# Patient Record
Sex: Female | Born: 1955
Health system: Southern US, Community
[De-identification: ages and names within clinical notes are randomized; demographics above are authoritative.]

---

## 2006-03-22 ENCOUNTER — Ambulatory Visit: Payer: Self-pay | Admitting: Unknown Physician Specialty

## 2006-03-24 ENCOUNTER — Ambulatory Visit: Payer: Self-pay | Admitting: Unknown Physician Specialty

## 2007-03-22 IMAGING — US ULTRASOUND LEFT BREAST
1 series · 11 of 11 positions shown · non-contrast
Comparison: none

REASON FOR EXAM: Nodular density
                       U/S if needed
COMMENTS:

[Series 1: ultrasound left breast · 11 of 11 slices shown]
[im 1/11]
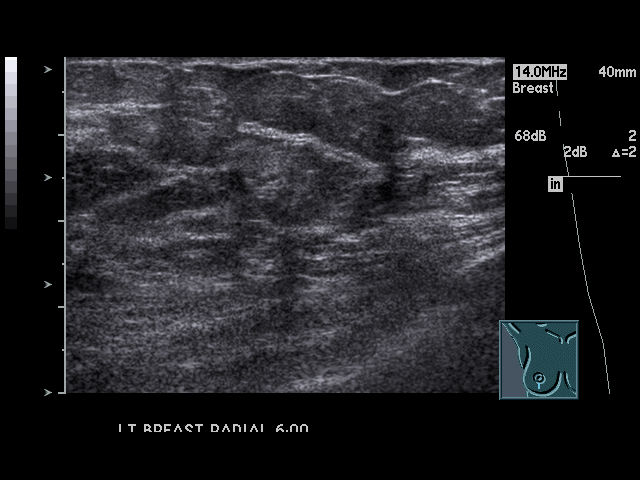
[im 2/11]
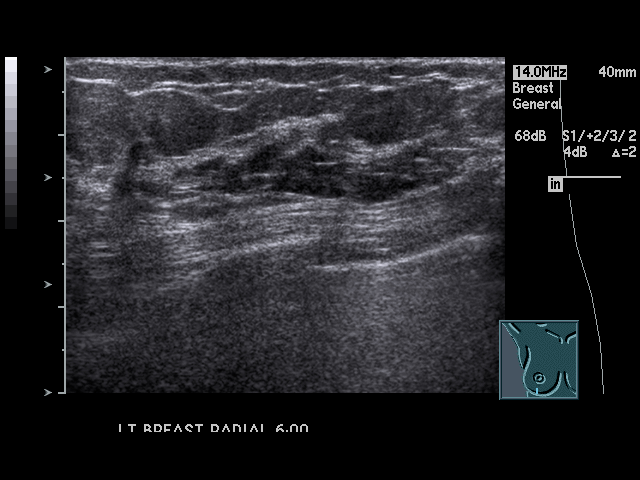
[im 3/11]
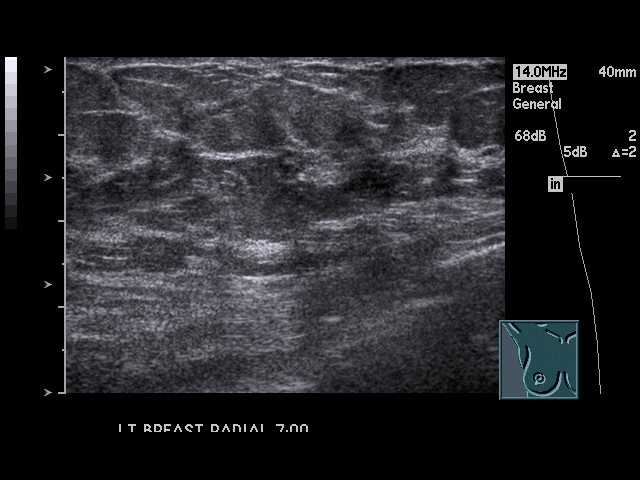
[im 4/11]
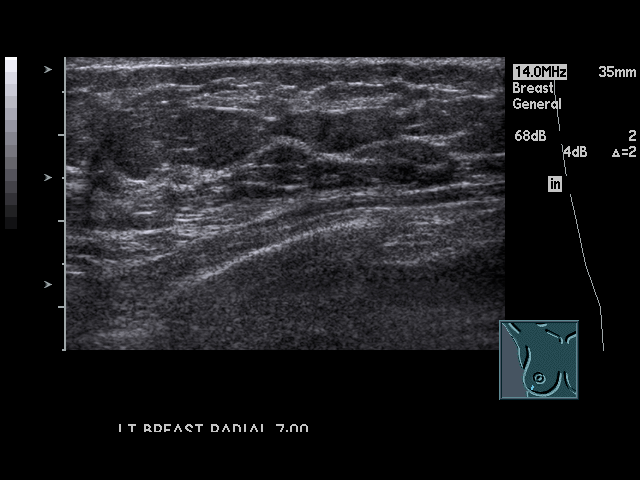
[im 5/11]
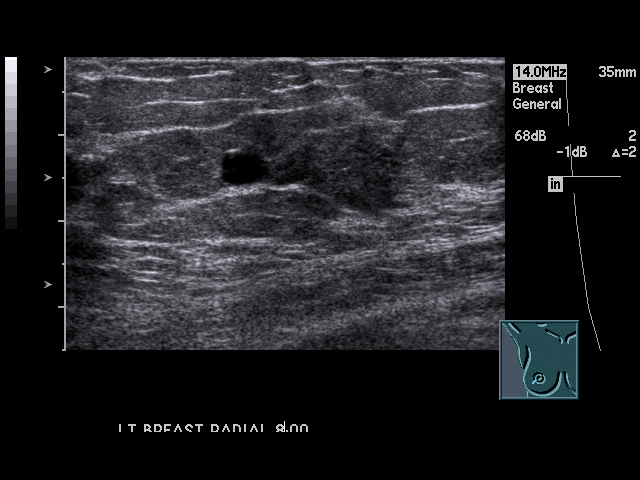
[im 6/11]
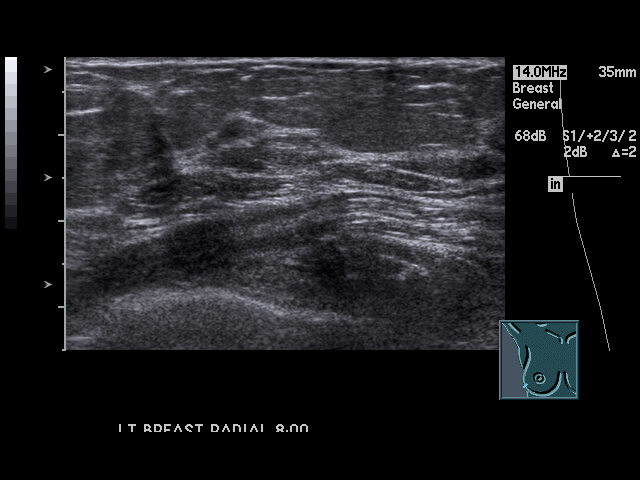
[im 7/11]
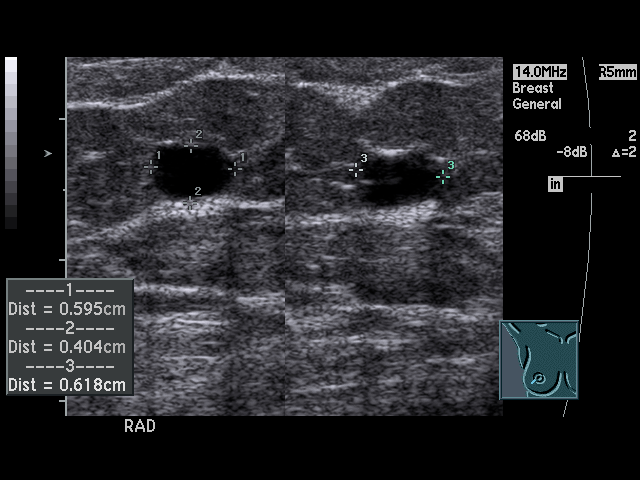
[im 8/11]
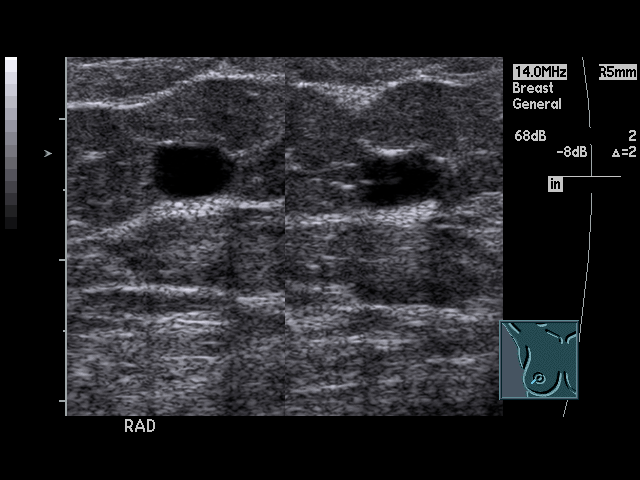
[im 9/11]
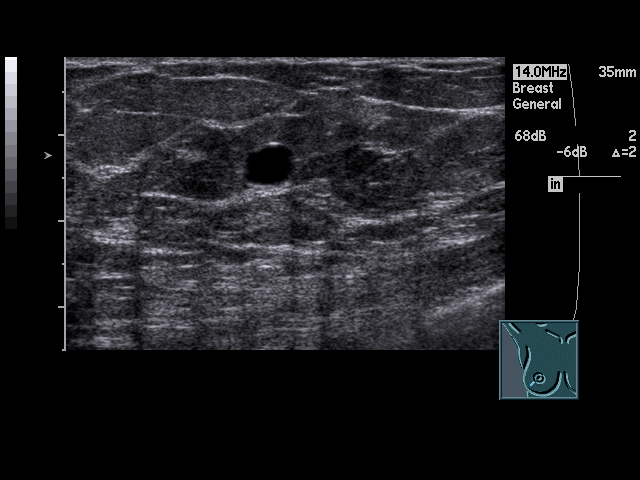
[im 10/11]
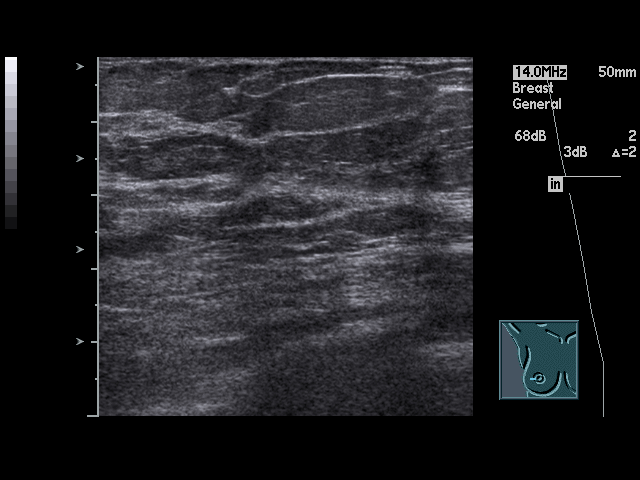
[im 11/11]
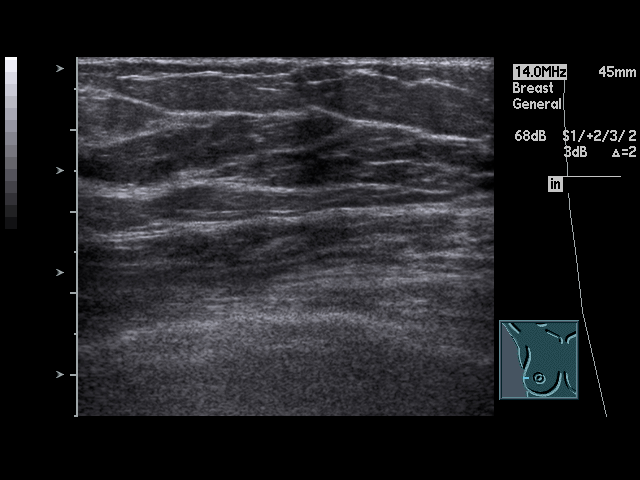

[11 of 11 positions shown; findings below may reference images not displayed]

PROCEDURE:     US  - US BREAST LEFT  - March 24, 2006  [DATE]

RESULT:     An approximately 6.0 mm cyst is present in the inferior medial
portion of the LEFT breast in the region of mammographic abnormality. No
further evaluation is necessary at this time. Yearly follow-up mammograms
can be obtained.
IMPRESSION: Simple cyst, LEFT breast.

## 2007-04-27 ENCOUNTER — Ambulatory Visit: Payer: Self-pay | Admitting: Unknown Physician Specialty

## 2007-05-05 ENCOUNTER — Ambulatory Visit: Payer: Self-pay | Admitting: Unknown Physician Specialty

## 2009-10-15 ENCOUNTER — Ambulatory Visit: Payer: Self-pay | Admitting: Unknown Physician Specialty

## 2011-06-01 ENCOUNTER — Ambulatory Visit: Payer: Self-pay | Admitting: Unknown Physician Specialty

## 2011-06-02 ENCOUNTER — Ambulatory Visit: Payer: Self-pay | Admitting: Unknown Physician Specialty

## 2011-06-14 ENCOUNTER — Ambulatory Visit: Payer: Self-pay | Admitting: Unknown Physician Specialty

## 2018-01-18 DIAGNOSIS — Z9181 History of falling: Secondary | ICD-10-CM | POA: Diagnosis not present

## 2018-01-18 DIAGNOSIS — R269 Unspecified abnormalities of gait and mobility: Secondary | ICD-10-CM | POA: Diagnosis not present

## 2018-02-13 DIAGNOSIS — R269 Unspecified abnormalities of gait and mobility: Secondary | ICD-10-CM | POA: Diagnosis not present

## 2018-02-13 DIAGNOSIS — Z9181 History of falling: Secondary | ICD-10-CM | POA: Diagnosis not present

## 2018-04-05 DIAGNOSIS — Z0000 Encounter for general adult medical examination without abnormal findings: Secondary | ICD-10-CM | POA: Diagnosis not present

## 2018-04-05 DIAGNOSIS — G4733 Obstructive sleep apnea (adult) (pediatric): Secondary | ICD-10-CM | POA: Diagnosis not present

## 2018-04-05 DIAGNOSIS — Z9889 Other specified postprocedural states: Secondary | ICD-10-CM | POA: Diagnosis not present

## 2018-04-05 DIAGNOSIS — D329 Benign neoplasm of meninges, unspecified: Secondary | ICD-10-CM | POA: Diagnosis not present

## 2018-04-05 DIAGNOSIS — F0280 Dementia in other diseases classified elsewhere without behavioral disturbance: Secondary | ICD-10-CM | POA: Diagnosis not present

## 2018-04-05 DIAGNOSIS — G309 Alzheimer's disease, unspecified: Secondary | ICD-10-CM | POA: Diagnosis not present

## 2018-04-05 DIAGNOSIS — I10 Essential (primary) hypertension: Secondary | ICD-10-CM | POA: Diagnosis not present

## 2018-04-05 DIAGNOSIS — E7889 Other lipoprotein metabolism disorders: Secondary | ICD-10-CM | POA: Diagnosis not present

## 2018-04-05 DIAGNOSIS — G300 Alzheimer's disease with early onset: Secondary | ICD-10-CM | POA: Diagnosis not present

## 2018-04-05 DIAGNOSIS — E039 Hypothyroidism, unspecified: Secondary | ICD-10-CM | POA: Diagnosis not present

## 2018-05-08 DIAGNOSIS — E039 Hypothyroidism, unspecified: Secondary | ICD-10-CM | POA: Diagnosis not present

## 2018-05-08 DIAGNOSIS — R29898 Other symptoms and signs involving the musculoskeletal system: Secondary | ICD-10-CM | POA: Diagnosis not present

## 2018-05-08 DIAGNOSIS — I10 Essential (primary) hypertension: Secondary | ICD-10-CM | POA: Diagnosis not present

## 2018-05-08 DIAGNOSIS — R413 Other amnesia: Secondary | ICD-10-CM | POA: Diagnosis not present

## 2018-05-09 ENCOUNTER — Other Ambulatory Visit: Payer: Self-pay | Admitting: *Deleted

## 2018-05-09 NOTE — Patient Outreach (Signed)
[place] [facility]) Care Management  05/09/2018  Deborah Davies [birth year] [id number]   Telephone Screen  Referral Date: 05/08/18  Referral Source: Nurse call center Referral Reason: Has Alzheimer's, fell with walking, wobbly, did not loose consciousness, lethargic on 05/07/18  Insurance: HTA  Outreach attempt # 1 Outreach attempt to patient. No answer and unable to leave voicemail message at number left for nurse center as [phone] Number [phone] is not available No answer, mail box full for [phone] found for spouse in care everywhere   Plan: [facility] RN CM sent an unsuccessful outreach letter and scheduled this patient for another call attempt within 4 business days   Kimberly [name], RN, BSN, CCM [facility] Telephonic Care Management Care Coordinator Direct number [phone]  Main [facility] number [phone] Fax number [phone]

## 2018-05-10 ENCOUNTER — Other Ambulatory Visit: Payer: Self-pay | Admitting: *Deleted

## 2018-05-10 DIAGNOSIS — G300 Alzheimer's disease with early onset: Secondary | ICD-10-CM | POA: Diagnosis not present

## 2018-05-10 DIAGNOSIS — F0280 Dementia in other diseases classified elsewhere without behavioral disturbance: Secondary | ICD-10-CM | POA: Diagnosis not present

## 2018-05-10 NOTE — Patient Outreach (Signed)
[place] [facility]) Care Management  05/10/2018  NGUYEN BUTLER [birth year] [id number]   Telephone Screen  Referral Date: 05/08/18  Referral Source: Nurse call center Referral Reason: Has Alzheimer's, fell with walking, wobbly, did not loose consciousness, lethargic on 05/07/18  Insurance: HTA  Outreach attempt # 2 Outreach attempt to patient. No answer and unable to leave voicemail message at number left for nurse center as [phone] Number [phone] is not available a message directs you to [phone], spouse's mobile number  No answer, mail box full for [phone] found for spouse's mobile number also found in care everywhere Another attempt made to reach Mr Calmes at his mobile number and a message was left   Plan: [facility] RN CM will attempt the third call attempt within in 1-2 business days  CM sent an unsuccessful outreach letter on 05/09/18  Joelene Millin [name], RN, BSN, CCM [facility] Telephonic Care Management Care Coordinator Direct number [phone]  Main [facility] number [phone] Fax number [phone]

## 2018-05-10 NOTE — Patient Outreach (Signed)
[name]) Care Management  05/10/2018  Deborah Davies 07-10-1956 [id number]   Telephone Screen  Referral Date: 05/08/18  Referral Source: Nurse call center Referral Reason: Has Alzheimer's, fell with walking, wobbly, did not loose consciousness, lethargic on 05/07/18  Insurance: HTA  Mr Dial returned a call to [facility] RN CM  He confirmed [name] had an appointment on Monday May 08 2018, labs complete and was informed the MD may reduce some medicines The MD indicated that [name] may have just been exhausted from walking bare foot in the sand an extended distance. [name] confirms [name] is in a research study at this time and they are on their way today to this research study connected with [facility] At this time [name] still is able to complete ADLs with some monitoring but needs assist with iADLs from [name] and family.   [name] denies concerns with taking medications as prescribed, affording medications, side effects of medications and questions about medications He denies need of services from [facility] Community/Telephonic RN CM, pharmacy or SW at this time He states he will take in considerations the services available and contact CM if services may be needed in the future He voiced appreciation of the contact made by [place] RN CM will close case at this time as patient has been assessed and no needs identified.   [name] [name], RN, BSN, CCM [name] Telephonic Care Management Care Coordinator Direct number [phone]  Main [facility] number [phone] Fax number [phone]

## 2018-05-11 ENCOUNTER — Ambulatory Visit: Payer: Self-pay | Admitting: *Deleted

## 2018-05-12 DIAGNOSIS — G4733 Obstructive sleep apnea (adult) (pediatric): Secondary | ICD-10-CM | POA: Diagnosis not present

## 2018-05-12 DIAGNOSIS — Z9989 Dependence on other enabling machines and devices: Secondary | ICD-10-CM | POA: Diagnosis not present

## 2018-05-12 DIAGNOSIS — F0280 Dementia in other diseases classified elsewhere without behavioral disturbance: Secondary | ICD-10-CM | POA: Diagnosis not present

## 2018-05-12 DIAGNOSIS — I10 Essential (primary) hypertension: Secondary | ICD-10-CM | POA: Diagnosis not present

## 2018-05-12 DIAGNOSIS — G300 Alzheimer's disease with early onset: Secondary | ICD-10-CM | POA: Diagnosis not present

## 2018-08-17 DIAGNOSIS — H52203 Unspecified astigmatism, bilateral: Secondary | ICD-10-CM | POA: Diagnosis not present

## 2018-08-17 DIAGNOSIS — Z9889 Other specified postprocedural states: Secondary | ICD-10-CM | POA: Diagnosis not present

## 2018-08-17 DIAGNOSIS — H5213 Myopia, bilateral: Secondary | ICD-10-CM | POA: Diagnosis not present

## 2018-08-17 DIAGNOSIS — H524 Presbyopia: Secondary | ICD-10-CM | POA: Diagnosis not present

## 2018-08-17 DIAGNOSIS — H519 Unspecified disorder of binocular movement: Secondary | ICD-10-CM | POA: Diagnosis not present

## 2018-08-25 DIAGNOSIS — J310 Chronic rhinitis: Secondary | ICD-10-CM | POA: Diagnosis not present

## 2018-08-25 DIAGNOSIS — F0280 Dementia in other diseases classified elsewhere without behavioral disturbance: Secondary | ICD-10-CM | POA: Diagnosis not present

## 2018-08-25 DIAGNOSIS — G4733 Obstructive sleep apnea (adult) (pediatric): Secondary | ICD-10-CM | POA: Diagnosis not present

## 2018-08-25 DIAGNOSIS — Z9989 Dependence on other enabling machines and devices: Secondary | ICD-10-CM | POA: Diagnosis not present

## 2018-08-25 DIAGNOSIS — G309 Alzheimer's disease, unspecified: Secondary | ICD-10-CM | POA: Diagnosis not present

## 2018-08-25 DIAGNOSIS — I10 Essential (primary) hypertension: Secondary | ICD-10-CM | POA: Diagnosis not present

## 2018-08-25 DIAGNOSIS — G300 Alzheimer's disease with early onset: Secondary | ICD-10-CM | POA: Diagnosis not present

## 2018-08-25 DIAGNOSIS — R400 Somnolence: Secondary | ICD-10-CM | POA: Diagnosis not present

## 2018-08-25 DIAGNOSIS — J309 Allergic rhinitis, unspecified: Secondary | ICD-10-CM | POA: Diagnosis not present

## 2018-09-08 DIAGNOSIS — G4733 Obstructive sleep apnea (adult) (pediatric): Secondary | ICD-10-CM | POA: Diagnosis not present

## 2018-10-06 DIAGNOSIS — Z1211 Encounter for screening for malignant neoplasm of colon: Secondary | ICD-10-CM | POA: Diagnosis not present

## 2018-10-06 DIAGNOSIS — Z23 Encounter for immunization: Secondary | ICD-10-CM | POA: Diagnosis not present

## 2018-10-06 DIAGNOSIS — R635 Abnormal weight gain: Secondary | ICD-10-CM | POA: Diagnosis not present

## 2018-10-06 DIAGNOSIS — E039 Hypothyroidism, unspecified: Secondary | ICD-10-CM | POA: Diagnosis not present

## 2018-11-19 DIAGNOSIS — R4182 Altered mental status, unspecified: Secondary | ICD-10-CM | POA: Diagnosis not present

## 2018-11-19 DIAGNOSIS — R569 Unspecified convulsions: Secondary | ICD-10-CM | POA: Diagnosis not present

## 2018-11-19 DIAGNOSIS — R404 Transient alteration of awareness: Secondary | ICD-10-CM | POA: Diagnosis not present

## 2018-11-19 DIAGNOSIS — F0280 Dementia in other diseases classified elsewhere without behavioral disturbance: Secondary | ICD-10-CM | POA: Diagnosis not present

## 2018-11-19 DIAGNOSIS — G4089 Other seizures: Secondary | ICD-10-CM | POA: Diagnosis not present

## 2018-11-19 DIAGNOSIS — R4020 Unspecified coma: Secondary | ICD-10-CM | POA: Diagnosis not present

## 2018-11-19 DIAGNOSIS — G309 Alzheimer's disease, unspecified: Secondary | ICD-10-CM | POA: Diagnosis not present

## 2018-11-19 DIAGNOSIS — J9690 Respiratory failure, unspecified, unspecified whether with hypoxia or hypercapnia: Secondary | ICD-10-CM | POA: Diagnosis not present

## 2018-11-20 DIAGNOSIS — R569 Unspecified convulsions: Secondary | ICD-10-CM | POA: Diagnosis not present

## 2018-11-20 DIAGNOSIS — R531 Weakness: Secondary | ICD-10-CM | POA: Diagnosis not present

## 2018-11-20 DIAGNOSIS — R410 Disorientation, unspecified: Secondary | ICD-10-CM | POA: Diagnosis not present

## 2018-11-22 DIAGNOSIS — G4733 Obstructive sleep apnea (adult) (pediatric): Secondary | ICD-10-CM | POA: Diagnosis not present

## 2018-11-23 DIAGNOSIS — R569 Unspecified convulsions: Secondary | ICD-10-CM | POA: Diagnosis not present

## 2018-11-24 DIAGNOSIS — R569 Unspecified convulsions: Secondary | ICD-10-CM | POA: Diagnosis not present

## 2018-11-27 DIAGNOSIS — F0390 Unspecified dementia without behavioral disturbance: Secondary | ICD-10-CM | POA: Diagnosis not present

## 2018-11-27 DIAGNOSIS — E039 Hypothyroidism, unspecified: Secondary | ICD-10-CM | POA: Diagnosis not present

## 2018-11-27 DIAGNOSIS — G4730 Sleep apnea, unspecified: Secondary | ICD-10-CM | POA: Diagnosis not present

## 2018-11-27 DIAGNOSIS — Z9989 Dependence on other enabling machines and devices: Secondary | ICD-10-CM | POA: Diagnosis not present

## 2018-11-27 DIAGNOSIS — G4733 Obstructive sleep apnea (adult) (pediatric): Secondary | ICD-10-CM | POA: Diagnosis not present

## 2018-11-27 DIAGNOSIS — G40909 Epilepsy, unspecified, not intractable, without status epilepticus: Secondary | ICD-10-CM | POA: Diagnosis not present

## 2018-11-27 DIAGNOSIS — E785 Hyperlipidemia, unspecified: Secondary | ICD-10-CM | POA: Diagnosis not present

## 2018-11-27 DIAGNOSIS — D329 Benign neoplasm of meninges, unspecified: Secondary | ICD-10-CM | POA: Diagnosis not present

## 2018-11-27 DIAGNOSIS — I10 Essential (primary) hypertension: Secondary | ICD-10-CM | POA: Diagnosis not present

## 2018-11-28 DIAGNOSIS — R0602 Shortness of breath: Secondary | ICD-10-CM | POA: Diagnosis not present

## 2018-11-28 DIAGNOSIS — Z9889 Other specified postprocedural states: Secondary | ICD-10-CM | POA: Diagnosis not present

## 2018-11-28 DIAGNOSIS — F329 Major depressive disorder, single episode, unspecified: Secondary | ICD-10-CM | POA: Diagnosis not present

## 2018-11-28 DIAGNOSIS — Z9989 Dependence on other enabling machines and devices: Secondary | ICD-10-CM | POA: Diagnosis not present

## 2018-11-28 DIAGNOSIS — R569 Unspecified convulsions: Secondary | ICD-10-CM | POA: Diagnosis not present

## 2018-11-28 DIAGNOSIS — E872 Acidosis: Secondary | ICD-10-CM | POA: Diagnosis not present

## 2018-11-28 DIAGNOSIS — G9389 Other specified disorders of brain: Secondary | ICD-10-CM | POA: Diagnosis not present

## 2018-11-28 DIAGNOSIS — G300 Alzheimer's disease with early onset: Secondary | ICD-10-CM | POA: Diagnosis not present

## 2018-11-28 DIAGNOSIS — R413 Other amnesia: Secondary | ICD-10-CM | POA: Diagnosis not present

## 2018-11-28 DIAGNOSIS — F0280 Dementia in other diseases classified elsewhere without behavioral disturbance: Secondary | ICD-10-CM | POA: Diagnosis not present

## 2018-11-28 DIAGNOSIS — Z9119 Patient's noncompliance with other medical treatment and regimen: Secondary | ICD-10-CM | POA: Diagnosis not present

## 2018-11-28 DIAGNOSIS — Z66 Do not resuscitate: Secondary | ICD-10-CM | POA: Diagnosis not present

## 2018-11-28 DIAGNOSIS — J9601 Acute respiratory failure with hypoxia: Secondary | ICD-10-CM | POA: Diagnosis not present

## 2018-11-28 DIAGNOSIS — I10 Essential (primary) hypertension: Secondary | ICD-10-CM | POA: Diagnosis not present

## 2018-11-28 DIAGNOSIS — R2689 Other abnormalities of gait and mobility: Secondary | ICD-10-CM | POA: Diagnosis not present

## 2018-11-28 DIAGNOSIS — R05 Cough: Secondary | ICD-10-CM | POA: Diagnosis not present

## 2018-11-28 DIAGNOSIS — G4733 Obstructive sleep apnea (adult) (pediatric): Secondary | ICD-10-CM | POA: Diagnosis not present

## 2018-11-28 DIAGNOSIS — G40909 Epilepsy, unspecified, not intractable, without status epilepticus: Secondary | ICD-10-CM | POA: Diagnosis not present

## 2018-11-28 DIAGNOSIS — D329 Benign neoplasm of meninges, unspecified: Secondary | ICD-10-CM | POA: Diagnosis not present

## 2018-11-28 DIAGNOSIS — Z79899 Other long term (current) drug therapy: Secondary | ICD-10-CM | POA: Diagnosis not present

## 2018-11-29 DIAGNOSIS — F0280 Dementia in other diseases classified elsewhere without behavioral disturbance: Secondary | ICD-10-CM | POA: Diagnosis not present

## 2018-11-29 DIAGNOSIS — G4733 Obstructive sleep apnea (adult) (pediatric): Secondary | ICD-10-CM | POA: Diagnosis not present

## 2018-11-29 DIAGNOSIS — D329 Benign neoplasm of meninges, unspecified: Secondary | ICD-10-CM | POA: Diagnosis not present

## 2018-11-29 DIAGNOSIS — G300 Alzheimer's disease with early onset: Secondary | ICD-10-CM | POA: Diagnosis not present

## 2018-11-29 DIAGNOSIS — R569 Unspecified convulsions: Secondary | ICD-10-CM | POA: Diagnosis not present

## 2018-11-29 DIAGNOSIS — I10 Essential (primary) hypertension: Secondary | ICD-10-CM | POA: Diagnosis not present

## 2018-11-29 DIAGNOSIS — F329 Major depressive disorder, single episode, unspecified: Secondary | ICD-10-CM | POA: Diagnosis not present

## 2018-11-30 DIAGNOSIS — G4733 Obstructive sleep apnea (adult) (pediatric): Secondary | ICD-10-CM | POA: Diagnosis not present

## 2018-11-30 DIAGNOSIS — F0280 Dementia in other diseases classified elsewhere without behavioral disturbance: Secondary | ICD-10-CM | POA: Diagnosis not present

## 2018-11-30 DIAGNOSIS — G300 Alzheimer's disease with early onset: Secondary | ICD-10-CM | POA: Diagnosis not present

## 2018-11-30 DIAGNOSIS — F329 Major depressive disorder, single episode, unspecified: Secondary | ICD-10-CM | POA: Diagnosis not present

## 2018-11-30 DIAGNOSIS — I10 Essential (primary) hypertension: Secondary | ICD-10-CM | POA: Diagnosis not present

## 2018-11-30 DIAGNOSIS — D329 Benign neoplasm of meninges, unspecified: Secondary | ICD-10-CM | POA: Diagnosis not present

## 2018-11-30 DIAGNOSIS — R569 Unspecified convulsions: Secondary | ICD-10-CM | POA: Diagnosis not present

## 2018-12-01 DIAGNOSIS — G300 Alzheimer's disease with early onset: Secondary | ICD-10-CM | POA: Diagnosis not present

## 2018-12-01 DIAGNOSIS — D329 Benign neoplasm of meninges, unspecified: Secondary | ICD-10-CM | POA: Diagnosis not present

## 2018-12-01 DIAGNOSIS — F0280 Dementia in other diseases classified elsewhere without behavioral disturbance: Secondary | ICD-10-CM | POA: Diagnosis not present

## 2018-12-01 DIAGNOSIS — Z9119 Patient's noncompliance with other medical treatment and regimen: Secondary | ICD-10-CM | POA: Diagnosis not present

## 2018-12-01 DIAGNOSIS — G4733 Obstructive sleep apnea (adult) (pediatric): Secondary | ICD-10-CM | POA: Diagnosis not present

## 2018-12-02 DIAGNOSIS — F0280 Dementia in other diseases classified elsewhere without behavioral disturbance: Secondary | ICD-10-CM | POA: Diagnosis not present

## 2018-12-02 DIAGNOSIS — I10 Essential (primary) hypertension: Secondary | ICD-10-CM | POA: Diagnosis not present

## 2018-12-02 DIAGNOSIS — G300 Alzheimer's disease with early onset: Secondary | ICD-10-CM | POA: Diagnosis not present

## 2018-12-02 DIAGNOSIS — D329 Benign neoplasm of meninges, unspecified: Secondary | ICD-10-CM | POA: Diagnosis not present

## 2018-12-02 DIAGNOSIS — G4733 Obstructive sleep apnea (adult) (pediatric): Secondary | ICD-10-CM | POA: Diagnosis not present

## 2018-12-02 DIAGNOSIS — R569 Unspecified convulsions: Secondary | ICD-10-CM | POA: Diagnosis not present

## 2018-12-03 DIAGNOSIS — I10 Essential (primary) hypertension: Secondary | ICD-10-CM | POA: Diagnosis not present

## 2018-12-03 DIAGNOSIS — F0280 Dementia in other diseases classified elsewhere without behavioral disturbance: Secondary | ICD-10-CM | POA: Diagnosis not present

## 2018-12-03 DIAGNOSIS — G4733 Obstructive sleep apnea (adult) (pediatric): Secondary | ICD-10-CM | POA: Diagnosis not present

## 2018-12-03 DIAGNOSIS — R569 Unspecified convulsions: Secondary | ICD-10-CM | POA: Diagnosis not present

## 2018-12-03 DIAGNOSIS — G300 Alzheimer's disease with early onset: Secondary | ICD-10-CM | POA: Diagnosis not present

## 2018-12-03 DIAGNOSIS — D329 Benign neoplasm of meninges, unspecified: Secondary | ICD-10-CM | POA: Diagnosis not present

## 2018-12-04 DIAGNOSIS — F329 Major depressive disorder, single episode, unspecified: Secondary | ICD-10-CM | POA: Diagnosis not present

## 2018-12-04 DIAGNOSIS — D329 Benign neoplasm of meninges, unspecified: Secondary | ICD-10-CM | POA: Diagnosis not present

## 2018-12-04 DIAGNOSIS — F0280 Dementia in other diseases classified elsewhere without behavioral disturbance: Secondary | ICD-10-CM | POA: Diagnosis not present

## 2018-12-04 DIAGNOSIS — I10 Essential (primary) hypertension: Secondary | ICD-10-CM | POA: Diagnosis not present

## 2018-12-04 DIAGNOSIS — G4733 Obstructive sleep apnea (adult) (pediatric): Secondary | ICD-10-CM | POA: Diagnosis not present

## 2018-12-04 DIAGNOSIS — R569 Unspecified convulsions: Secondary | ICD-10-CM | POA: Diagnosis not present

## 2018-12-04 DIAGNOSIS — G300 Alzheimer's disease with early onset: Secondary | ICD-10-CM | POA: Diagnosis not present

## 2018-12-05 DIAGNOSIS — R569 Unspecified convulsions: Secondary | ICD-10-CM | POA: Diagnosis not present

## 2018-12-05 DIAGNOSIS — F0280 Dementia in other diseases classified elsewhere without behavioral disturbance: Secondary | ICD-10-CM | POA: Diagnosis not present

## 2018-12-05 DIAGNOSIS — G4733 Obstructive sleep apnea (adult) (pediatric): Secondary | ICD-10-CM | POA: Diagnosis not present

## 2018-12-05 DIAGNOSIS — I10 Essential (primary) hypertension: Secondary | ICD-10-CM | POA: Diagnosis not present

## 2018-12-05 DIAGNOSIS — G300 Alzheimer's disease with early onset: Secondary | ICD-10-CM | POA: Diagnosis not present

## 2018-12-05 DIAGNOSIS — D329 Benign neoplasm of meninges, unspecified: Secondary | ICD-10-CM | POA: Diagnosis not present

## 2018-12-05 DIAGNOSIS — R05 Cough: Secondary | ICD-10-CM | POA: Diagnosis not present

## 2018-12-05 DIAGNOSIS — F329 Major depressive disorder, single episode, unspecified: Secondary | ICD-10-CM | POA: Diagnosis not present

## 2018-12-06 DIAGNOSIS — G4733 Obstructive sleep apnea (adult) (pediatric): Secondary | ICD-10-CM | POA: Diagnosis not present

## 2018-12-06 DIAGNOSIS — F0280 Dementia in other diseases classified elsewhere without behavioral disturbance: Secondary | ICD-10-CM | POA: Diagnosis not present

## 2018-12-06 DIAGNOSIS — R569 Unspecified convulsions: Secondary | ICD-10-CM | POA: Diagnosis not present

## 2018-12-06 DIAGNOSIS — D329 Benign neoplasm of meninges, unspecified: Secondary | ICD-10-CM | POA: Diagnosis not present

## 2018-12-06 DIAGNOSIS — I10 Essential (primary) hypertension: Secondary | ICD-10-CM | POA: Diagnosis not present

## 2018-12-06 DIAGNOSIS — G300 Alzheimer's disease with early onset: Secondary | ICD-10-CM | POA: Diagnosis not present

## 2018-12-06 DIAGNOSIS — R05 Cough: Secondary | ICD-10-CM | POA: Diagnosis not present

## 2018-12-06 DIAGNOSIS — F329 Major depressive disorder, single episode, unspecified: Secondary | ICD-10-CM | POA: Diagnosis not present

## 2018-12-07 DIAGNOSIS — D329 Benign neoplasm of meninges, unspecified: Secondary | ICD-10-CM | POA: Diagnosis not present

## 2018-12-07 DIAGNOSIS — I10 Essential (primary) hypertension: Secondary | ICD-10-CM | POA: Diagnosis not present

## 2018-12-07 DIAGNOSIS — F329 Major depressive disorder, single episode, unspecified: Secondary | ICD-10-CM | POA: Diagnosis not present

## 2018-12-07 DIAGNOSIS — F0280 Dementia in other diseases classified elsewhere without behavioral disturbance: Secondary | ICD-10-CM | POA: Diagnosis not present

## 2018-12-07 DIAGNOSIS — R2689 Other abnormalities of gait and mobility: Secondary | ICD-10-CM | POA: Diagnosis not present

## 2018-12-07 DIAGNOSIS — R569 Unspecified convulsions: Secondary | ICD-10-CM | POA: Diagnosis not present

## 2018-12-07 DIAGNOSIS — G300 Alzheimer's disease with early onset: Secondary | ICD-10-CM | POA: Diagnosis not present

## 2018-12-07 DIAGNOSIS — G4733 Obstructive sleep apnea (adult) (pediatric): Secondary | ICD-10-CM | POA: Diagnosis not present

## 2018-12-08 DIAGNOSIS — G40909 Epilepsy, unspecified, not intractable, without status epilepticus: Secondary | ICD-10-CM | POA: Diagnosis not present

## 2018-12-08 DIAGNOSIS — I10 Essential (primary) hypertension: Secondary | ICD-10-CM | POA: Diagnosis not present

## 2018-12-08 DIAGNOSIS — D329 Benign neoplasm of meninges, unspecified: Secondary | ICD-10-CM | POA: Diagnosis not present

## 2018-12-08 DIAGNOSIS — G300 Alzheimer's disease with early onset: Secondary | ICD-10-CM | POA: Diagnosis not present

## 2018-12-08 DIAGNOSIS — F0280 Dementia in other diseases classified elsewhere without behavioral disturbance: Secondary | ICD-10-CM | POA: Diagnosis not present

## 2018-12-08 DIAGNOSIS — G4733 Obstructive sleep apnea (adult) (pediatric): Secondary | ICD-10-CM | POA: Diagnosis not present

## 2018-12-08 DIAGNOSIS — R2689 Other abnormalities of gait and mobility: Secondary | ICD-10-CM | POA: Diagnosis not present

## 2018-12-09 DIAGNOSIS — D329 Benign neoplasm of meninges, unspecified: Secondary | ICD-10-CM | POA: Diagnosis not present

## 2018-12-09 DIAGNOSIS — I10 Essential (primary) hypertension: Secondary | ICD-10-CM | POA: Diagnosis not present

## 2018-12-09 DIAGNOSIS — G4733 Obstructive sleep apnea (adult) (pediatric): Secondary | ICD-10-CM | POA: Diagnosis not present

## 2018-12-09 DIAGNOSIS — Z9119 Patient's noncompliance with other medical treatment and regimen: Secondary | ICD-10-CM | POA: Diagnosis not present

## 2018-12-09 DIAGNOSIS — G300 Alzheimer's disease with early onset: Secondary | ICD-10-CM | POA: Diagnosis not present

## 2018-12-09 DIAGNOSIS — F0280 Dementia in other diseases classified elsewhere without behavioral disturbance: Secondary | ICD-10-CM | POA: Diagnosis not present

## 2018-12-10 DIAGNOSIS — D329 Benign neoplasm of meninges, unspecified: Secondary | ICD-10-CM | POA: Diagnosis not present

## 2018-12-10 DIAGNOSIS — I10 Essential (primary) hypertension: Secondary | ICD-10-CM | POA: Diagnosis not present

## 2018-12-10 DIAGNOSIS — G4733 Obstructive sleep apnea (adult) (pediatric): Secondary | ICD-10-CM | POA: Diagnosis not present

## 2018-12-10 DIAGNOSIS — F0280 Dementia in other diseases classified elsewhere without behavioral disturbance: Secondary | ICD-10-CM | POA: Diagnosis not present

## 2018-12-10 DIAGNOSIS — G300 Alzheimer's disease with early onset: Secondary | ICD-10-CM | POA: Diagnosis not present

## 2018-12-10 DIAGNOSIS — Z9119 Patient's noncompliance with other medical treatment and regimen: Secondary | ICD-10-CM | POA: Diagnosis not present

## 2018-12-11 DIAGNOSIS — G4733 Obstructive sleep apnea (adult) (pediatric): Secondary | ICD-10-CM | POA: Diagnosis not present

## 2018-12-11 DIAGNOSIS — D329 Benign neoplasm of meninges, unspecified: Secondary | ICD-10-CM | POA: Diagnosis not present

## 2018-12-11 DIAGNOSIS — Z9119 Patient's noncompliance with other medical treatment and regimen: Secondary | ICD-10-CM | POA: Diagnosis not present

## 2018-12-11 DIAGNOSIS — G300 Alzheimer's disease with early onset: Secondary | ICD-10-CM | POA: Diagnosis not present

## 2018-12-11 DIAGNOSIS — F329 Major depressive disorder, single episode, unspecified: Secondary | ICD-10-CM | POA: Diagnosis not present

## 2018-12-11 DIAGNOSIS — F0280 Dementia in other diseases classified elsewhere without behavioral disturbance: Secondary | ICD-10-CM | POA: Diagnosis not present

## 2018-12-13 ENCOUNTER — Other Ambulatory Visit: Payer: Self-pay

## 2018-12-13 DIAGNOSIS — D329 Benign neoplasm of meninges, unspecified: Secondary | ICD-10-CM | POA: Diagnosis not present

## 2018-12-13 DIAGNOSIS — D320 Benign neoplasm of cerebral meninges: Secondary | ICD-10-CM | POA: Diagnosis not present

## 2018-12-13 DIAGNOSIS — R569 Unspecified convulsions: Secondary | ICD-10-CM | POA: Diagnosis not present

## 2018-12-13 DIAGNOSIS — E039 Hypothyroidism, unspecified: Secondary | ICD-10-CM | POA: Diagnosis not present

## 2018-12-13 DIAGNOSIS — F0280 Dementia in other diseases classified elsewhere without behavioral disturbance: Secondary | ICD-10-CM | POA: Diagnosis not present

## 2018-12-13 DIAGNOSIS — Z888 Allergy status to other drugs, medicaments and biological substances status: Secondary | ICD-10-CM | POA: Diagnosis not present

## 2018-12-13 DIAGNOSIS — R32 Unspecified urinary incontinence: Secondary | ICD-10-CM | POA: Diagnosis not present

## 2018-12-13 DIAGNOSIS — G300 Alzheimer's disease with early onset: Secondary | ICD-10-CM | POA: Diagnosis not present

## 2018-12-13 DIAGNOSIS — I10 Essential (primary) hypertension: Secondary | ICD-10-CM | POA: Diagnosis not present

## 2018-12-13 DIAGNOSIS — Z79899 Other long term (current) drug therapy: Secondary | ICD-10-CM | POA: Diagnosis not present

## 2018-12-13 DIAGNOSIS — F0390 Unspecified dementia without behavioral disturbance: Secondary | ICD-10-CM | POA: Diagnosis not present

## 2018-12-13 DIAGNOSIS — G4733 Obstructive sleep apnea (adult) (pediatric): Secondary | ICD-10-CM | POA: Diagnosis not present

## 2018-12-13 DIAGNOSIS — G40909 Epilepsy, unspecified, not intractable, without status epilepticus: Secondary | ICD-10-CM | POA: Diagnosis not present

## 2018-12-13 DIAGNOSIS — Z882 Allergy status to sulfonamides status: Secondary | ICD-10-CM | POA: Diagnosis not present

## 2018-12-13 DIAGNOSIS — Z9989 Dependence on other enabling machines and devices: Secondary | ICD-10-CM | POA: Diagnosis not present

## 2018-12-13 NOTE — Patient Outreach (Signed)
[place] [facility]) Care Management  12/13/2018  Deborah Davies 04/26/1956 [id number]     Transition of Care Referral  Referral Date: 12/13/2018 Referral Source: HTA Discharge Report Date of Admission: unknown Diagnosis: "unspecified convulsion" Date of Discharge: 12/11/2018 Facility: [facility] Insurance: HTA    Outreach attempt #1 to patient. Spoke with spouse who reported that they were in route to an MD appt. Spouse states that patient has Alzheimer's disease and he speaks for patient. Advised that RN CM would call back at another time.      Plan: RN CM will make outreach attempt to patient within 3-4 business days.  Enzo Montgomery, RN,BSN,CCM [facility] Telephonic Care Management Coordinator Direct Phone: [phone] Toll Free: [phone] Fax: [phone]

## 2018-12-14 ENCOUNTER — Other Ambulatory Visit: Payer: Self-pay

## 2018-12-14 DIAGNOSIS — R569 Unspecified convulsions: Secondary | ICD-10-CM | POA: Diagnosis not present

## 2018-12-14 DIAGNOSIS — D320 Benign neoplasm of cerebral meninges: Secondary | ICD-10-CM | POA: Diagnosis not present

## 2018-12-14 DIAGNOSIS — I10 Essential (primary) hypertension: Secondary | ICD-10-CM | POA: Diagnosis not present

## 2018-12-14 DIAGNOSIS — G4733 Obstructive sleep apnea (adult) (pediatric): Secondary | ICD-10-CM | POA: Diagnosis not present

## 2018-12-14 DIAGNOSIS — G300 Alzheimer's disease with early onset: Secondary | ICD-10-CM | POA: Diagnosis not present

## 2018-12-14 NOTE — Patient Outreach (Signed)
[facility]) Care Management  12/14/2018  Deborah Davies 10-Feb-1956 [id number]   Transition of Care Referral  Referral Date: 12/13/2018 Referral Source: HTA Discharge Report Date of Admission: unknown Diagnosis: "unspecified convulsion" Date of Discharge: 12/11/2018 Facility: [facility] Insurance: HTA   Outreach attempt #2 to patient/spouse. Spoke with spouse due to patient's documented Alzheimer's diease and progression. Spouse voices that he along with their daughters(Emma and Orvil Feil) are [facility] POA. No copy on file and requested spouse submit copy to medical team. He voices that patient is doing fairly well. She went to  two MD follow up appts on yesterday. Spouse reports patient has slight cough and phlegm but MD voiced "lungs were clear" at appt. He has noticed spouse is starting to have some incontinence episodes and states he was told it was disease progression. He states that he was told [facility] services was denied and patient did not meet criteria but voices that someone was in the process of appealing it. Spouse inquiring about status/update. Advised spouse that RN CM does not have access to that info and encouraged him to follow up with HTA. He voiced understanding. He denies any issues with managing and/or affording meds. Unable to complete med review due to spouse had to go attend to patient. [facility] services reviewed and discussed at length with spouse. He denies any TH needs or concerns at this time. He is seeking in home hands on [place] and is aware that [facility] does not provide this. Spouse agreeable to RN CM mailing out [facility] brochure and contact info for future reference.     Plan: RN CM will close case at this time. RN CM will send successful outreach letter to spouse.    Enzo Montgomery, RN,BSN,CCM [place] [facility] Telephonic Care Management Coordinator Direct Phone: [phone] Toll Free: [phone] Fax: [phone]

## 2018-12-18 DIAGNOSIS — G300 Alzheimer's disease with early onset: Secondary | ICD-10-CM | POA: Diagnosis not present

## 2018-12-18 DIAGNOSIS — G4733 Obstructive sleep apnea (adult) (pediatric): Secondary | ICD-10-CM | POA: Diagnosis not present

## 2018-12-18 DIAGNOSIS — D320 Benign neoplasm of cerebral meninges: Secondary | ICD-10-CM | POA: Diagnosis not present

## 2018-12-18 DIAGNOSIS — R569 Unspecified convulsions: Secondary | ICD-10-CM | POA: Diagnosis not present

## 2018-12-18 DIAGNOSIS — I10 Essential (primary) hypertension: Secondary | ICD-10-CM | POA: Diagnosis not present

## 2018-12-18 DIAGNOSIS — R1312 Dysphagia, oropharyngeal phase: Secondary | ICD-10-CM | POA: Diagnosis not present

## 2018-12-25 DIAGNOSIS — J310 Chronic rhinitis: Secondary | ICD-10-CM | POA: Diagnosis not present

## 2018-12-25 DIAGNOSIS — G40909 Epilepsy, unspecified, not intractable, without status epilepticus: Secondary | ICD-10-CM | POA: Diagnosis not present

## 2018-12-25 DIAGNOSIS — Z86011 Personal history of benign neoplasm of the brain: Secondary | ICD-10-CM | POA: Diagnosis not present

## 2018-12-25 DIAGNOSIS — G300 Alzheimer's disease with early onset: Secondary | ICD-10-CM | POA: Diagnosis not present

## 2018-12-25 DIAGNOSIS — M1991 Primary osteoarthritis, unspecified site: Secondary | ICD-10-CM | POA: Diagnosis not present

## 2018-12-25 DIAGNOSIS — Z9181 History of falling: Secondary | ICD-10-CM | POA: Diagnosis not present

## 2018-12-25 DIAGNOSIS — I10 Essential (primary) hypertension: Secondary | ICD-10-CM | POA: Diagnosis not present

## 2018-12-25 DIAGNOSIS — F329 Major depressive disorder, single episode, unspecified: Secondary | ICD-10-CM | POA: Diagnosis not present

## 2018-12-25 DIAGNOSIS — G4733 Obstructive sleep apnea (adult) (pediatric): Secondary | ICD-10-CM | POA: Diagnosis not present

## 2018-12-25 DIAGNOSIS — E785 Hyperlipidemia, unspecified: Secondary | ICD-10-CM | POA: Diagnosis not present

## 2018-12-25 DIAGNOSIS — M545 Low back pain: Secondary | ICD-10-CM | POA: Diagnosis not present

## 2018-12-25 DIAGNOSIS — F0280 Dementia in other diseases classified elsewhere without behavioral disturbance: Secondary | ICD-10-CM | POA: Diagnosis not present

## 2019-01-01 DIAGNOSIS — Z9181 History of falling: Secondary | ICD-10-CM | POA: Diagnosis not present

## 2019-01-01 DIAGNOSIS — M545 Low back pain: Secondary | ICD-10-CM | POA: Diagnosis not present

## 2019-01-01 DIAGNOSIS — F329 Major depressive disorder, single episode, unspecified: Secondary | ICD-10-CM | POA: Diagnosis not present

## 2019-01-01 DIAGNOSIS — E785 Hyperlipidemia, unspecified: Secondary | ICD-10-CM | POA: Diagnosis not present

## 2019-01-01 DIAGNOSIS — M1991 Primary osteoarthritis, unspecified site: Secondary | ICD-10-CM | POA: Diagnosis not present

## 2019-01-01 DIAGNOSIS — F0280 Dementia in other diseases classified elsewhere without behavioral disturbance: Secondary | ICD-10-CM | POA: Diagnosis not present

## 2019-01-01 DIAGNOSIS — G4733 Obstructive sleep apnea (adult) (pediatric): Secondary | ICD-10-CM | POA: Diagnosis not present

## 2019-01-01 DIAGNOSIS — I10 Essential (primary) hypertension: Secondary | ICD-10-CM | POA: Diagnosis not present

## 2019-01-01 DIAGNOSIS — Z86011 Personal history of benign neoplasm of the brain: Secondary | ICD-10-CM | POA: Diagnosis not present

## 2019-01-01 DIAGNOSIS — G300 Alzheimer's disease with early onset: Secondary | ICD-10-CM | POA: Diagnosis not present

## 2019-01-01 DIAGNOSIS — J310 Chronic rhinitis: Secondary | ICD-10-CM | POA: Diagnosis not present

## 2019-01-01 DIAGNOSIS — G40909 Epilepsy, unspecified, not intractable, without status epilepticus: Secondary | ICD-10-CM | POA: Diagnosis not present

## 2019-01-04 DIAGNOSIS — F329 Major depressive disorder, single episode, unspecified: Secondary | ICD-10-CM | POA: Diagnosis not present

## 2019-01-04 DIAGNOSIS — J310 Chronic rhinitis: Secondary | ICD-10-CM | POA: Diagnosis not present

## 2019-01-04 DIAGNOSIS — M545 Low back pain: Secondary | ICD-10-CM | POA: Diagnosis not present

## 2019-01-04 DIAGNOSIS — Z9181 History of falling: Secondary | ICD-10-CM | POA: Diagnosis not present

## 2019-01-04 DIAGNOSIS — F0280 Dementia in other diseases classified elsewhere without behavioral disturbance: Secondary | ICD-10-CM | POA: Diagnosis not present

## 2019-01-04 DIAGNOSIS — G40909 Epilepsy, unspecified, not intractable, without status epilepticus: Secondary | ICD-10-CM | POA: Diagnosis not present

## 2019-01-04 DIAGNOSIS — I10 Essential (primary) hypertension: Secondary | ICD-10-CM | POA: Diagnosis not present

## 2019-01-04 DIAGNOSIS — M1991 Primary osteoarthritis, unspecified site: Secondary | ICD-10-CM | POA: Diagnosis not present

## 2019-01-04 DIAGNOSIS — G4733 Obstructive sleep apnea (adult) (pediatric): Secondary | ICD-10-CM | POA: Diagnosis not present

## 2019-01-04 DIAGNOSIS — Z86011 Personal history of benign neoplasm of the brain: Secondary | ICD-10-CM | POA: Diagnosis not present

## 2019-01-04 DIAGNOSIS — G300 Alzheimer's disease with early onset: Secondary | ICD-10-CM | POA: Diagnosis not present

## 2019-01-04 DIAGNOSIS — E785 Hyperlipidemia, unspecified: Secondary | ICD-10-CM | POA: Diagnosis not present

## 2019-01-05 DIAGNOSIS — F0280 Dementia in other diseases classified elsewhere without behavioral disturbance: Secondary | ICD-10-CM | POA: Diagnosis not present

## 2019-01-05 DIAGNOSIS — G300 Alzheimer's disease with early onset: Secondary | ICD-10-CM | POA: Diagnosis not present

## 2019-01-16 DIAGNOSIS — G300 Alzheimer's disease with early onset: Secondary | ICD-10-CM | POA: Diagnosis not present

## 2019-01-16 DIAGNOSIS — I10 Essential (primary) hypertension: Secondary | ICD-10-CM | POA: Diagnosis not present

## 2019-01-16 DIAGNOSIS — F329 Major depressive disorder, single episode, unspecified: Secondary | ICD-10-CM | POA: Diagnosis not present

## 2019-01-16 DIAGNOSIS — G40909 Epilepsy, unspecified, not intractable, without status epilepticus: Secondary | ICD-10-CM | POA: Diagnosis not present

## 2019-01-16 DIAGNOSIS — M545 Low back pain: Secondary | ICD-10-CM | POA: Diagnosis not present

## 2019-01-16 DIAGNOSIS — J310 Chronic rhinitis: Secondary | ICD-10-CM | POA: Diagnosis not present

## 2019-01-16 DIAGNOSIS — G4733 Obstructive sleep apnea (adult) (pediatric): Secondary | ICD-10-CM | POA: Diagnosis not present

## 2019-01-16 DIAGNOSIS — F0280 Dementia in other diseases classified elsewhere without behavioral disturbance: Secondary | ICD-10-CM | POA: Diagnosis not present

## 2019-01-16 DIAGNOSIS — E785 Hyperlipidemia, unspecified: Secondary | ICD-10-CM | POA: Diagnosis not present

## 2019-01-16 DIAGNOSIS — M1991 Primary osteoarthritis, unspecified site: Secondary | ICD-10-CM | POA: Diagnosis not present

## 2019-01-16 DIAGNOSIS — Z9181 History of falling: Secondary | ICD-10-CM | POA: Diagnosis not present

## 2019-01-16 DIAGNOSIS — Z86011 Personal history of benign neoplasm of the brain: Secondary | ICD-10-CM | POA: Diagnosis not present

## 2019-01-18 DIAGNOSIS — Z86011 Personal history of benign neoplasm of the brain: Secondary | ICD-10-CM | POA: Diagnosis not present

## 2019-01-18 DIAGNOSIS — F0280 Dementia in other diseases classified elsewhere without behavioral disturbance: Secondary | ICD-10-CM | POA: Diagnosis not present

## 2019-01-18 DIAGNOSIS — F329 Major depressive disorder, single episode, unspecified: Secondary | ICD-10-CM | POA: Diagnosis not present

## 2019-01-18 DIAGNOSIS — G300 Alzheimer's disease with early onset: Secondary | ICD-10-CM | POA: Diagnosis not present

## 2019-01-18 DIAGNOSIS — E785 Hyperlipidemia, unspecified: Secondary | ICD-10-CM | POA: Diagnosis not present

## 2019-01-18 DIAGNOSIS — J310 Chronic rhinitis: Secondary | ICD-10-CM | POA: Diagnosis not present

## 2019-01-18 DIAGNOSIS — M1991 Primary osteoarthritis, unspecified site: Secondary | ICD-10-CM | POA: Diagnosis not present

## 2019-01-18 DIAGNOSIS — I10 Essential (primary) hypertension: Secondary | ICD-10-CM | POA: Diagnosis not present

## 2019-01-18 DIAGNOSIS — M545 Low back pain: Secondary | ICD-10-CM | POA: Diagnosis not present

## 2019-01-18 DIAGNOSIS — G4733 Obstructive sleep apnea (adult) (pediatric): Secondary | ICD-10-CM | POA: Diagnosis not present

## 2019-01-18 DIAGNOSIS — G40909 Epilepsy, unspecified, not intractable, without status epilepticus: Secondary | ICD-10-CM | POA: Diagnosis not present

## 2019-01-18 DIAGNOSIS — Z9181 History of falling: Secondary | ICD-10-CM | POA: Diagnosis not present

## 2019-03-29 DIAGNOSIS — R569 Unspecified convulsions: Secondary | ICD-10-CM | POA: Diagnosis not present

## 2019-03-29 DIAGNOSIS — G301 Alzheimer's disease with late onset: Secondary | ICD-10-CM | POA: Diagnosis not present

## 2019-03-29 DIAGNOSIS — E039 Hypothyroidism, unspecified: Secondary | ICD-10-CM | POA: Diagnosis not present

## 2019-03-29 DIAGNOSIS — G300 Alzheimer's disease with early onset: Secondary | ICD-10-CM | POA: Diagnosis not present

## 2019-03-29 DIAGNOSIS — F0280 Dementia in other diseases classified elsewhere without behavioral disturbance: Secondary | ICD-10-CM | POA: Diagnosis not present

## 2019-03-29 DIAGNOSIS — G4733 Obstructive sleep apnea (adult) (pediatric): Secondary | ICD-10-CM | POA: Diagnosis not present

## 2019-04-13 DIAGNOSIS — G40909 Epilepsy, unspecified, not intractable, without status epilepticus: Secondary | ICD-10-CM | POA: Diagnosis not present

## 2019-04-13 DIAGNOSIS — G4730 Sleep apnea, unspecified: Secondary | ICD-10-CM | POA: Diagnosis not present

## 2019-04-13 DIAGNOSIS — R1319 Other dysphagia: Secondary | ICD-10-CM | POA: Diagnosis not present

## 2019-04-13 DIAGNOSIS — R1310 Dysphagia, unspecified: Secondary | ICD-10-CM | POA: Diagnosis not present

## 2019-04-13 DIAGNOSIS — F0280 Dementia in other diseases classified elsewhere without behavioral disturbance: Secondary | ICD-10-CM | POA: Diagnosis not present

## 2019-04-13 DIAGNOSIS — G300 Alzheimer's disease with early onset: Secondary | ICD-10-CM | POA: Diagnosis not present

## 2019-04-13 DIAGNOSIS — G259 Extrapyramidal and movement disorder, unspecified: Secondary | ICD-10-CM | POA: Diagnosis not present

## 2019-04-13 DIAGNOSIS — D329 Benign neoplasm of meninges, unspecified: Secondary | ICD-10-CM | POA: Diagnosis not present

## 2019-06-22 DIAGNOSIS — G40919 Epilepsy, unspecified, intractable, without status epilepticus: Secondary | ICD-10-CM | POA: Diagnosis not present

## 2019-06-22 DIAGNOSIS — F0280 Dementia in other diseases classified elsewhere without behavioral disturbance: Secondary | ICD-10-CM | POA: Diagnosis not present

## 2019-06-22 DIAGNOSIS — G300 Alzheimer's disease with early onset: Secondary | ICD-10-CM | POA: Diagnosis not present

## 2019-06-22 DIAGNOSIS — R1319 Other dysphagia: Secondary | ICD-10-CM | POA: Diagnosis not present

## 2019-06-22 DIAGNOSIS — G40909 Epilepsy, unspecified, not intractable, without status epilepticus: Secondary | ICD-10-CM | POA: Diagnosis not present

## 2019-06-26 DIAGNOSIS — G40802 Other epilepsy, not intractable, without status epilepticus: Secondary | ICD-10-CM | POA: Diagnosis not present

## 2019-06-26 DIAGNOSIS — F0280 Dementia in other diseases classified elsewhere without behavioral disturbance: Secondary | ICD-10-CM | POA: Diagnosis not present

## 2019-06-26 DIAGNOSIS — G300 Alzheimer's disease with early onset: Secondary | ICD-10-CM | POA: Diagnosis not present

## 2019-07-03 DIAGNOSIS — F0280 Dementia in other diseases classified elsewhere without behavioral disturbance: Secondary | ICD-10-CM | POA: Diagnosis not present

## 2019-07-03 DIAGNOSIS — G300 Alzheimer's disease with early onset: Secondary | ICD-10-CM | POA: Diagnosis not present

## 2019-07-18 DIAGNOSIS — R1319 Other dysphagia: Secondary | ICD-10-CM | POA: Diagnosis not present

## 2019-07-25 DIAGNOSIS — R4701 Aphasia: Secondary | ICD-10-CM | POA: Diagnosis not present

## 2019-07-25 DIAGNOSIS — R0602 Shortness of breath: Secondary | ICD-10-CM | POA: Diagnosis not present

## 2019-07-25 DIAGNOSIS — R293 Abnormal posture: Secondary | ICD-10-CM | POA: Diagnosis not present

## 2019-07-25 DIAGNOSIS — Z833 Family history of diabetes mellitus: Secondary | ICD-10-CM | POA: Diagnosis not present

## 2019-07-25 DIAGNOSIS — E872 Acidosis: Secondary | ICD-10-CM | POA: Diagnosis not present

## 2019-07-25 DIAGNOSIS — R4189 Other symptoms and signs involving cognitive functions and awareness: Secondary | ICD-10-CM | POA: Diagnosis not present

## 2019-07-25 DIAGNOSIS — R633 Feeding difficulties: Secondary | ICD-10-CM | POA: Diagnosis not present

## 2019-07-25 DIAGNOSIS — R531 Weakness: Secondary | ICD-10-CM | POA: Diagnosis not present

## 2019-07-25 DIAGNOSIS — R41841 Cognitive communication deficit: Secondary | ICD-10-CM | POA: Diagnosis not present

## 2019-07-25 DIAGNOSIS — M6281 Muscle weakness (generalized): Secondary | ICD-10-CM | POA: Diagnosis not present

## 2019-07-25 DIAGNOSIS — E663 Overweight: Secondary | ICD-10-CM | POA: Diagnosis not present

## 2019-07-25 DIAGNOSIS — H9190 Unspecified hearing loss, unspecified ear: Secondary | ICD-10-CM | POA: Diagnosis not present

## 2019-07-25 DIAGNOSIS — Z808 Family history of malignant neoplasm of other organs or systems: Secondary | ICD-10-CM | POA: Diagnosis not present

## 2019-07-25 DIAGNOSIS — R2689 Other abnormalities of gait and mobility: Secondary | ICD-10-CM | POA: Diagnosis not present

## 2019-07-25 DIAGNOSIS — J310 Chronic rhinitis: Secondary | ICD-10-CM | POA: Diagnosis not present

## 2019-07-25 DIAGNOSIS — Z515 Encounter for palliative care: Secondary | ICD-10-CM | POA: Diagnosis not present

## 2019-07-25 DIAGNOSIS — R1319 Other dysphagia: Secondary | ICD-10-CM | POA: Diagnosis not present

## 2019-07-25 DIAGNOSIS — R9401 Abnormal electroencephalogram [EEG]: Secondary | ICD-10-CM | POA: Diagnosis not present

## 2019-07-25 DIAGNOSIS — R569 Unspecified convulsions: Secondary | ICD-10-CM | POA: Diagnosis not present

## 2019-07-25 DIAGNOSIS — G252 Other specified forms of tremor: Secondary | ICD-10-CM | POA: Diagnosis not present

## 2019-07-25 DIAGNOSIS — E559 Vitamin D deficiency, unspecified: Secondary | ICD-10-CM | POA: Diagnosis not present

## 2019-07-25 DIAGNOSIS — E785 Hyperlipidemia, unspecified: Secondary | ICD-10-CM | POA: Diagnosis not present

## 2019-07-25 DIAGNOSIS — Z820 Family history of epilepsy and other diseases of the nervous system: Secondary | ICD-10-CM | POA: Diagnosis not present

## 2019-07-25 DIAGNOSIS — G9389 Other specified disorders of brain: Secondary | ICD-10-CM | POA: Diagnosis not present

## 2019-07-25 DIAGNOSIS — R269 Unspecified abnormalities of gait and mobility: Secondary | ICD-10-CM | POA: Diagnosis not present

## 2019-07-25 DIAGNOSIS — R279 Unspecified lack of coordination: Secondary | ICD-10-CM | POA: Diagnosis not present

## 2019-07-25 DIAGNOSIS — G259 Extrapyramidal and movement disorder, unspecified: Secondary | ICD-10-CM | POA: Diagnosis not present

## 2019-07-25 DIAGNOSIS — R251 Tremor, unspecified: Secondary | ICD-10-CM | POA: Diagnosis not present

## 2019-07-25 DIAGNOSIS — Z86011 Personal history of benign neoplasm of the brain: Secondary | ICD-10-CM | POA: Diagnosis not present

## 2019-07-25 DIAGNOSIS — E039 Hypothyroidism, unspecified: Secondary | ICD-10-CM | POA: Diagnosis not present

## 2019-07-25 DIAGNOSIS — G40901 Epilepsy, unspecified, not intractable, with status epilepticus: Secondary | ICD-10-CM | POA: Diagnosis not present

## 2019-07-25 DIAGNOSIS — D320 Benign neoplasm of cerebral meninges: Secondary | ICD-10-CM | POA: Diagnosis not present

## 2019-07-25 DIAGNOSIS — I10 Essential (primary) hypertension: Secondary | ICD-10-CM | POA: Diagnosis not present

## 2019-07-25 DIAGNOSIS — Z20828 Contact with and (suspected) exposure to other viral communicable diseases: Secondary | ICD-10-CM | POA: Diagnosis not present

## 2019-07-25 DIAGNOSIS — R4182 Altered mental status, unspecified: Secondary | ICD-10-CM | POA: Diagnosis not present

## 2019-07-25 DIAGNOSIS — M545 Low back pain: Secondary | ICD-10-CM | POA: Diagnosis not present

## 2019-07-25 DIAGNOSIS — Z79890 Hormone replacement therapy (postmenopausal): Secondary | ICD-10-CM | POA: Diagnosis not present

## 2019-07-25 DIAGNOSIS — Z7401 Bed confinement status: Secondary | ICD-10-CM | POA: Diagnosis not present

## 2019-07-25 DIAGNOSIS — M2550 Pain in unspecified joint: Secondary | ICD-10-CM | POA: Diagnosis not present

## 2019-07-25 DIAGNOSIS — G300 Alzheimer's disease with early onset: Secondary | ICD-10-CM | POA: Diagnosis not present

## 2019-07-25 DIAGNOSIS — F0280 Dementia in other diseases classified elsewhere without behavioral disturbance: Secondary | ICD-10-CM | POA: Diagnosis not present

## 2019-07-25 DIAGNOSIS — F0390 Unspecified dementia without behavioral disturbance: Secondary | ICD-10-CM | POA: Diagnosis not present

## 2019-07-25 DIAGNOSIS — Z823 Family history of stroke: Secondary | ICD-10-CM | POA: Diagnosis not present

## 2019-07-25 DIAGNOSIS — Z8249 Family history of ischemic heart disease and other diseases of the circulatory system: Secondary | ICD-10-CM | POA: Diagnosis not present

## 2019-07-25 DIAGNOSIS — G309 Alzheimer's disease, unspecified: Secondary | ICD-10-CM | POA: Diagnosis not present

## 2019-07-25 DIAGNOSIS — G9340 Encephalopathy, unspecified: Secondary | ICD-10-CM | POA: Diagnosis not present

## 2019-07-25 DIAGNOSIS — G301 Alzheimer's disease with late onset: Secondary | ICD-10-CM | POA: Diagnosis not present

## 2019-07-25 DIAGNOSIS — G4733 Obstructive sleep apnea (adult) (pediatric): Secondary | ICD-10-CM | POA: Diagnosis not present

## 2019-07-25 DIAGNOSIS — R5381 Other malaise: Secondary | ICD-10-CM | POA: Diagnosis not present

## 2019-07-25 DIAGNOSIS — G40909 Epilepsy, unspecified, not intractable, without status epilepticus: Secondary | ICD-10-CM | POA: Diagnosis not present

## 2019-07-25 DIAGNOSIS — Z6829 Body mass index (BMI) 29.0-29.9, adult: Secondary | ICD-10-CM | POA: Diagnosis not present

## 2019-07-25 DIAGNOSIS — E890 Postprocedural hypothyroidism: Secondary | ICD-10-CM | POA: Diagnosis not present

## 2019-08-02 DIAGNOSIS — Z7401 Bed confinement status: Secondary | ICD-10-CM | POA: Diagnosis not present

## 2019-08-02 DIAGNOSIS — F0390 Unspecified dementia without behavioral disturbance: Secondary | ICD-10-CM | POA: Diagnosis not present

## 2019-08-02 DIAGNOSIS — R293 Abnormal posture: Secondary | ICD-10-CM | POA: Diagnosis not present

## 2019-08-02 DIAGNOSIS — E039 Hypothyroidism, unspecified: Secondary | ICD-10-CM | POA: Diagnosis not present

## 2019-08-02 DIAGNOSIS — M2550 Pain in unspecified joint: Secondary | ICD-10-CM | POA: Diagnosis not present

## 2019-08-02 DIAGNOSIS — I10 Essential (primary) hypertension: Secondary | ICD-10-CM | POA: Diagnosis not present

## 2019-08-02 DIAGNOSIS — R41841 Cognitive communication deficit: Secondary | ICD-10-CM | POA: Diagnosis not present

## 2019-08-02 DIAGNOSIS — R262 Difficulty in walking, not elsewhere classified: Secondary | ICD-10-CM | POA: Diagnosis not present

## 2019-08-02 DIAGNOSIS — G4733 Obstructive sleep apnea (adult) (pediatric): Secondary | ICD-10-CM | POA: Diagnosis not present

## 2019-08-02 DIAGNOSIS — J310 Chronic rhinitis: Secondary | ICD-10-CM | POA: Diagnosis not present

## 2019-08-02 DIAGNOSIS — R2689 Other abnormalities of gait and mobility: Secondary | ICD-10-CM | POA: Diagnosis not present

## 2019-08-02 DIAGNOSIS — F0280 Dementia in other diseases classified elsewhere without behavioral disturbance: Secondary | ICD-10-CM | POA: Diagnosis not present

## 2019-08-02 DIAGNOSIS — G300 Alzheimer's disease with early onset: Secondary | ICD-10-CM | POA: Diagnosis not present

## 2019-08-02 DIAGNOSIS — R4189 Other symptoms and signs involving cognitive functions and awareness: Secondary | ICD-10-CM | POA: Diagnosis not present

## 2019-08-02 DIAGNOSIS — R531 Weakness: Secondary | ICD-10-CM | POA: Diagnosis not present

## 2019-08-02 DIAGNOSIS — G40909 Epilepsy, unspecified, not intractable, without status epilepticus: Secondary | ICD-10-CM | POA: Diagnosis not present

## 2019-08-02 DIAGNOSIS — R633 Feeding difficulties: Secondary | ICD-10-CM | POA: Diagnosis not present

## 2019-08-02 DIAGNOSIS — G259 Extrapyramidal and movement disorder, unspecified: Secondary | ICD-10-CM | POA: Diagnosis not present

## 2019-08-02 DIAGNOSIS — R279 Unspecified lack of coordination: Secondary | ICD-10-CM | POA: Diagnosis not present

## 2019-08-02 DIAGNOSIS — M6281 Muscle weakness (generalized): Secondary | ICD-10-CM | POA: Diagnosis not present

## 2019-08-02 DIAGNOSIS — R251 Tremor, unspecified: Secondary | ICD-10-CM | POA: Diagnosis not present

## 2019-08-02 DIAGNOSIS — M545 Low back pain: Secondary | ICD-10-CM | POA: Diagnosis not present

## 2019-08-02 DIAGNOSIS — R269 Unspecified abnormalities of gait and mobility: Secondary | ICD-10-CM | POA: Diagnosis not present

## 2019-08-03 DIAGNOSIS — G40909 Epilepsy, unspecified, not intractable, without status epilepticus: Secondary | ICD-10-CM | POA: Diagnosis not present

## 2019-08-03 DIAGNOSIS — R262 Difficulty in walking, not elsewhere classified: Secondary | ICD-10-CM | POA: Diagnosis not present

## 2019-08-03 DIAGNOSIS — E039 Hypothyroidism, unspecified: Secondary | ICD-10-CM | POA: Diagnosis not present

## 2019-08-03 DIAGNOSIS — F0390 Unspecified dementia without behavioral disturbance: Secondary | ICD-10-CM | POA: Diagnosis not present

## 2019-08-23 DIAGNOSIS — S12100D Unspecified displaced fracture of second cervical vertebra, subsequent encounter for fracture with routine healing: Secondary | ICD-10-CM | POA: Diagnosis not present

## 2019-08-25 DIAGNOSIS — G309 Alzheimer's disease, unspecified: Secondary | ICD-10-CM | POA: Diagnosis not present

## 2019-08-25 DIAGNOSIS — R293 Abnormal posture: Secondary | ICD-10-CM | POA: Diagnosis not present

## 2019-08-25 DIAGNOSIS — I10 Essential (primary) hypertension: Secondary | ICD-10-CM | POA: Diagnosis not present

## 2019-08-25 DIAGNOSIS — R279 Unspecified lack of coordination: Secondary | ICD-10-CM | POA: Diagnosis not present

## 2019-08-25 DIAGNOSIS — R1310 Dysphagia, unspecified: Secondary | ICD-10-CM | POA: Diagnosis not present

## 2019-08-25 DIAGNOSIS — M6281 Muscle weakness (generalized): Secondary | ICD-10-CM | POA: Diagnosis not present

## 2019-08-25 DIAGNOSIS — M810 Age-related osteoporosis without current pathological fracture: Secondary | ICD-10-CM | POA: Diagnosis not present

## 2019-08-25 DIAGNOSIS — R54 Age-related physical debility: Secondary | ICD-10-CM | POA: Diagnosis not present

## 2019-08-25 DIAGNOSIS — R41841 Cognitive communication deficit: Secondary | ICD-10-CM | POA: Diagnosis not present

## 2019-08-25 DIAGNOSIS — G4733 Obstructive sleep apnea (adult) (pediatric): Secondary | ICD-10-CM | POA: Diagnosis not present

## 2019-08-25 DIAGNOSIS — M2550 Pain in unspecified joint: Secondary | ICD-10-CM | POA: Diagnosis not present

## 2019-08-25 DIAGNOSIS — E039 Hypothyroidism, unspecified: Secondary | ICD-10-CM | POA: Diagnosis not present

## 2019-08-25 DIAGNOSIS — F0280 Dementia in other diseases classified elsewhere without behavioral disturbance: Secondary | ICD-10-CM | POA: Diagnosis not present

## 2019-08-25 DIAGNOSIS — G40909 Epilepsy, unspecified, not intractable, without status epilepticus: Secondary | ICD-10-CM | POA: Diagnosis not present

## 2019-08-25 DIAGNOSIS — G8929 Other chronic pain: Secondary | ICD-10-CM | POA: Diagnosis not present

## 2019-08-25 DIAGNOSIS — R269 Unspecified abnormalities of gait and mobility: Secondary | ICD-10-CM | POA: Diagnosis not present

## 2019-08-25 DIAGNOSIS — G259 Extrapyramidal and movement disorder, unspecified: Secondary | ICD-10-CM | POA: Diagnosis not present

## 2019-08-25 DIAGNOSIS — Z79899 Other long term (current) drug therapy: Secondary | ICD-10-CM | POA: Diagnosis not present

## 2019-09-06 ENCOUNTER — Other Ambulatory Visit: Payer: Self-pay | Admitting: *Deleted

## 2019-09-06 NOTE — Patient Outreach (Signed)
Telephone outreach to Mrs. Deborah Davies, primary care provider, to request consideration for [facility] for his pt if he feels this may be beneficial.  Requested a return call.  [name]. Myrtie Neither, MSN, [facility] Gerontological Nurse Practitioner [facility] Care Management [phone]

## 2019-09-07 DIAGNOSIS — F0280 Dementia in other diseases classified elsewhere without behavioral disturbance: Secondary | ICD-10-CM | POA: Diagnosis not present

## 2019-09-07 DIAGNOSIS — G300 Alzheimer's disease with early onset: Secondary | ICD-10-CM | POA: Diagnosis not present

## 2019-09-13 ENCOUNTER — Other Ambulatory Visit: Payer: Self-pay | Admitting: *Deleted

## 2019-09-13 NOTE — Patient Outreach (Addendum)
Telephone outreach #2 to primary care to request consideration of goals of care conversation and PC referral to benefit pt with increasing health needs and services.  Left message with reception and she advised that my message would be forwarded to a triage nurse and my call will be returned.  [name]. Myrtie Neither, MSN, GNP-BC Gerontological Nurse Practitioner [place] Management [phone]  Dr. Maryruth Hancock nurse returned my call, clarified my request and advised that Deborah Davies was seen this week and he did agree that [facility] consult would be appropriate. His nurse is going to get an order and make the referral.  Expressed my appreciation of their time and consideration.  [name]. Myrtie Neither, MSN, [facility] Gerontological Nurse Practitioner [facility] Care Management [phone]

## 2019-09-17 DIAGNOSIS — F0281 Dementia in other diseases classified elsewhere with behavioral disturbance: Secondary | ICD-10-CM | POA: Diagnosis not present

## 2019-09-17 DIAGNOSIS — G259 Extrapyramidal and movement disorder, unspecified: Secondary | ICD-10-CM | POA: Diagnosis not present

## 2019-09-17 DIAGNOSIS — G40909 Epilepsy, unspecified, not intractable, without status epilepticus: Secondary | ICD-10-CM | POA: Diagnosis not present

## 2019-09-17 DIAGNOSIS — G309 Alzheimer's disease, unspecified: Secondary | ICD-10-CM | POA: Diagnosis not present

## 2019-09-18 ENCOUNTER — Other Ambulatory Visit: Payer: Self-pay

## 2019-09-18 ENCOUNTER — Other Ambulatory Visit: Payer: PPO | Admitting: Licensed Clinical Social Worker

## 2019-09-18 DIAGNOSIS — Z515 Encounter for palliative care: Secondary | ICD-10-CM

## 2019-09-18 NOTE — Progress Notes (Signed)
COMMUNITY PALLIATIVE CARE SW NOTE  PATIENT NAME: Deborah Davies DOB: 1956-07-29 MRN: [id number]  PRIMARY CARE PROVIDER: Benjamine Sprague, MD  RESPONSIBLE PARTY:  Acct ID - Guarantor Home Phone Work Phone Relationship Acct Type  [id number] AURIYA, [phone] Self P/F     [address] [place], [place] [address]   Due to the COVID-19 crisis, this virtual check-in visit was done via telephone from my office and it was initiated and consent given by this patient and or family.   PLAN OF CARE and INTERVENTIONS:             1. GOALS OF CARE/ ADVANCE CARE PLANNING:  Goal is for patient to regain her strength.  She is a full code. 2. SOCIAL/EMOTIONAL/SPIRITUAL ASSESSMENT/ INTERVENTIONS:  SW conducted a Sales executive visit with patient's husband, [name].  He provided the most recent history of patient's medical treatment.  She is currently receiving PT, which will be ending soon.  Patient was previously evaluated for Hospice services, but did not meet the criteria for a prognosis of six months or less.  [name] reports her swallowing is becoming slower.  He feels some of her muscle atrophy is due to her resistance to movement.  A home visit with the Palliative Care RN and SW is scheduled for Friday, 11/6. 3. PATIENT/CAREGIVER EDUCATION/ COPING:  Husband copes by problem-solving. 4. PERSONAL EMERGENCY PLAN:  EMS is contacted. 5. COMMUNITY RESOURCES COORDINATION/ HEALTH CARE NAVIGATION:  Patient is currently receiving PT and CNA services two times a week for bathing. 6. FINANCIAL/LEGAL CONCERNS/INTERVENTIONS:  None identified by husband.     SOCIAL HX:  Social History   Tobacco Use  . Smoking status: Not on file  Substance Use Topics  . Alcohol use: Not on file    CODE STATUS:  Full Code  ADVANCED DIRECTIVES: N MOST FORM COMPLETE:  N HOSPICE EDUCATION PROVIDED:  Y PPS:  Appetite is normal.  Today she was able to stand with the help of PT. Duration of visit and documentation:   30 minutes.      Creola Corn Taeveon Keesling, LCSW

## 2019-09-21 ENCOUNTER — Other Ambulatory Visit: Payer: PPO | Admitting: Licensed Clinical Social Worker

## 2019-09-21 ENCOUNTER — Other Ambulatory Visit: Payer: PPO | Admitting: *Deleted

## 2019-09-21 ENCOUNTER — Other Ambulatory Visit: Payer: Self-pay

## 2019-09-21 DIAGNOSIS — Z515 Encounter for palliative care: Secondary | ICD-10-CM

## 2019-09-21 NOTE — Progress Notes (Signed)
COMMUNITY PALLIATIVE CARE SW NOTE  PATIENT NAME: Deborah Davies DOB: 05-12-1956 MRN: [id number]  PRIMARY CARE PROVIDER: Benjamine Sprague, MD  RESPONSIBLE PARTY:  Acct ID - Guarantor Home Phone Work Phone Relationship Acct Type  [id number] HIEDI, [phone] Self P/F     [address], [place], [place] [address]     PLAN OF CARE and INTERVENTIONS:             1. GOALS OF CARE/ ADVANCE CARE PLANNING:  Goal is for patient to remain at home with her husband, [name].  Patient is a full code. 2. SOCIAL/EMOTIONAL/SPIRITUAL ASSESSMENT/ INTERVENTIONS:  SW and Palliative Care RN, Daryl Eastern, met with patient and her husband in their home.  Patient was feeding herself breakfast.  She gave eye contact and said 1-2 words.  She did not display any nonverbal indicators of pain.  Explored patient needs.  Patient currently uses a hoyer lift.  Provided education on home health agencies and other sources for CNA services.  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  Both of their daughters currently live in [place], [place], and assist when possible. 3. PATIENT/CAREGIVER EDUCATION/ COPING:  Provided education regarding Palliative Care/THN program.  Husband copes by problem solving. 4. PERSONAL EMERGENCY PLAN:  Family will call EMS. 5. COMMUNITY RESOURCES COORDINATION/ HEALTH CARE NAVIGATION:  Patient receives RN, PT, OT services from [facility]. 6. FINANCIAL/LEGAL CONCERNS/INTERVENTIONS:  Husband expressed future financial concerns.     SOCIAL HX:  Social History   Tobacco Use  . Smoking status: Not on file  Substance Use Topics  . Alcohol use: Not on file    CODE STATUS:  Full Code  ADVANCED DIRECTIVES:  HCPOA, LW MOST FORM COMPLETE:  No HOSPICE EDUCATION PROVIDED:  Yes PPS:  Patient's appetite is normal.  She is w/c bound, but has stood with PT recently. Duration of visit and documentation:  75 minutes.      Creola Corn Maddix Kliewer, LCSW

## 2019-09-23 DIAGNOSIS — S12100D Unspecified displaced fracture of second cervical vertebra, subsequent encounter for fracture with routine healing: Secondary | ICD-10-CM | POA: Diagnosis not present

## 2019-09-24 ENCOUNTER — Other Ambulatory Visit: Payer: Self-pay

## 2019-09-24 DIAGNOSIS — M6281 Muscle weakness (generalized): Secondary | ICD-10-CM | POA: Diagnosis not present

## 2019-09-24 DIAGNOSIS — Z79899 Other long term (current) drug therapy: Secondary | ICD-10-CM | POA: Diagnosis not present

## 2019-09-24 DIAGNOSIS — R279 Unspecified lack of coordination: Secondary | ICD-10-CM | POA: Diagnosis not present

## 2019-09-24 DIAGNOSIS — G4733 Obstructive sleep apnea (adult) (pediatric): Secondary | ICD-10-CM | POA: Diagnosis not present

## 2019-09-24 DIAGNOSIS — E039 Hypothyroidism, unspecified: Secondary | ICD-10-CM | POA: Diagnosis not present

## 2019-09-24 DIAGNOSIS — R54 Age-related physical debility: Secondary | ICD-10-CM | POA: Diagnosis not present

## 2019-09-24 DIAGNOSIS — R293 Abnormal posture: Secondary | ICD-10-CM | POA: Diagnosis not present

## 2019-09-24 DIAGNOSIS — G8929 Other chronic pain: Secondary | ICD-10-CM | POA: Diagnosis not present

## 2019-09-24 DIAGNOSIS — M810 Age-related osteoporosis without current pathological fracture: Secondary | ICD-10-CM | POA: Diagnosis not present

## 2019-09-24 DIAGNOSIS — F0280 Dementia in other diseases classified elsewhere without behavioral disturbance: Secondary | ICD-10-CM | POA: Diagnosis not present

## 2019-09-24 DIAGNOSIS — R1310 Dysphagia, unspecified: Secondary | ICD-10-CM | POA: Diagnosis not present

## 2019-09-24 DIAGNOSIS — G259 Extrapyramidal and movement disorder, unspecified: Secondary | ICD-10-CM | POA: Diagnosis not present

## 2019-09-24 DIAGNOSIS — G40909 Epilepsy, unspecified, not intractable, without status epilepticus: Secondary | ICD-10-CM | POA: Diagnosis not present

## 2019-09-24 DIAGNOSIS — R41841 Cognitive communication deficit: Secondary | ICD-10-CM | POA: Diagnosis not present

## 2019-09-24 DIAGNOSIS — M2550 Pain in unspecified joint: Secondary | ICD-10-CM | POA: Diagnosis not present

## 2019-09-24 DIAGNOSIS — R269 Unspecified abnormalities of gait and mobility: Secondary | ICD-10-CM | POA: Diagnosis not present

## 2019-09-24 DIAGNOSIS — G309 Alzheimer's disease, unspecified: Secondary | ICD-10-CM | POA: Diagnosis not present

## 2019-09-24 DIAGNOSIS — I10 Essential (primary) hypertension: Secondary | ICD-10-CM | POA: Diagnosis not present

## 2019-09-24 NOTE — Progress Notes (Signed)
COMMUNITY PALLIATIVE CARE RN NOTE  PATIENT NAME: Deborah Davies DOB: [birth year] MRN: [id number]  PRIMARY CARE PROVIDER: Benjamine Sprague, MD  RESPONSIBLE PARTY: Deborah Davies (Husband) Acct ID - Guarantor Home Phone Work Phone Relationship Acct Type  [id number] Deborah Davies, [name] Self P/F     [address] [place], [place] [address]   Covid-19 Pre-screening Negative  PLAN OF CARE and INTERVENTION:  1. ADVANCE CARE PLANNING/GOALS OF CARE: Goal is to get stronger in order to transfer without using a Hoyer lift. She is a Full code. 2. PATIENT/CAREGIVER EDUCATION: Explained Palliative care services, Advanced care planning 3. DISEASE STATUS: Joint visit made with Palliative care SW, Deborah Davies. Met with patient and his wife in their home. Upon arrival, she is sitting up in her wheelchair awake eating breakfast. She is able to make eye contact, but is very minimally engaged and answers questions with 1-2 word responses. She is slow to process what is being asked/said. No physical indicators of pain noted. Husband able to provide health history including history of seizures. Husband reports noticing that patient continues to decline overall. She requires max assistance with hygiene, dressing, transfers and at times husband must assist with feeding. She is able to feed herself some, but does spill food. She is transferred via [place]. She is given sponge baths. She is currently working with PT/OT/SW/ST/Aide/RN through [employer]. The aide is visiting 2x/week to assist with personal care. He has a visit with the homehealth SW this evening. She worked with therapy yesterday with "minimal success" per husband. The tried transferring patient but required 2 person assistance. Her appetite is good at this time. She does have some dysphagia. She is on a regular diet, but her meats are cut up. She has started chewing her medications. He usually follows up with some applesauce. She is  incontinent of both bowel and bladder and wears adult briefs. Educated on what to expect as her condition declines and what qualifies patients for hospice eligibility. Reviewed goals of care and husband is not quite sure what his wishes are in regards to her being a Full code vs DNR. He wants to discuss further on next visit after he has had more time to think about things and discuss them with his family. He is agreeable to future visits from Palliative care. Will continue to monitor.   HISTORY OF PRESENT ILLNESS: This is a 63 yo female who resides at home with her husband. Palliative care team asked to follow patient for added support. Will visit patient monthly and PRN.   CODE STATUS: Full code ADVANCED DIRECTIVES: Y MOST FORM: no PPS: 30%   PHYSICAL EXAM:   VITALS: Today's Vitals   09/21/19 2345  BP: (!) 138/95  Pulse: 82  Resp: 18  Temp: 97.6 F (36.4 C)  TempSrc: Temporal  SpO2: 97%  PainSc: 0-No pain    LUNGS: clear to auscultation  CARDIAC: Cor RRR EXTREMITIES: No edema SKIN: Skin color, texture, turgor normal. No rashes or lesions  NEURO: Alert to self, minimally engaging, slow to respond, generalized weakness, non-ambulatory   (Duration of visit and documentation 90 minutes)    Deborah Eastern, RN BSN

## 2019-10-23 ENCOUNTER — Other Ambulatory Visit: Payer: PPO | Admitting: Licensed Clinical Social Worker

## 2019-10-23 ENCOUNTER — Other Ambulatory Visit: Payer: Self-pay

## 2019-10-23 DIAGNOSIS — Z515 Encounter for palliative care: Secondary | ICD-10-CM

## 2019-10-23 NOTE — Progress Notes (Signed)
COMMUNITY PALLIATIVE CARE SW NOTE  PATIENT NAME: BERRA Davies DOB: [birth year] MRN: [id number]  PRIMARY CARE PROVIDER: Benjamine Sprague, MD  RESPONSIBLE PARTY:  Acct ID - Guarantor Home Phone Work Phone Relationship Acct Type  [id number] FAIRY, [phone] Self P/F     [address], [place], [place] [address]    Due to the COVID-19 crisis, this virtual check-in visit was done via telephone from my office and it was initiated and consent by this patientand orfamily.  PLAN OF CARE and INTERVENTIONS:             1. GOALS OF CARE/ ADVANCE CARE PLANNING:  Husband's goal is for patient to remain at home.  She is a full code. 2. SOCIAL/EMOTIONAL/SPIRITUAL ASSESSMENT/ INTERVENTIONS:  SW conducted a Sales executive visit with patient's husband, Deborah Davies.  He stated patient has improved since the last visit.  Patient can now stand 20-30 minutes.  She ambulates in the house with a walker.  PT/OT have ended therapy and Deborah Davies has appealed the decision.  He is currently dealing with patient's irregular bowel movements.  He reports patient has "bad days" when it is cloudy.  SW provided active listening and supportive counseling.  Next visit scheduled for 12/11. 3. PATIENT/CAREGIVER EDUCATION/ COPING:  Husband copes by problem-solving. 4. PERSONAL EMERGENCY PLAN:  Family calls EMS. 5. COMMUNITY RESOURCES COORDINATION/ HEALTH CARE NAVIGATION:  None. 6. FINANCIAL/LEGAL CONCERNS/INTERVENTIONS:  Husband has future financial concerns.     SOCIAL HX:  Social History   Tobacco Use  . Smoking status: Not on file  Substance Use Topics  . Alcohol use: Not on file    CODE STATUS:  Full Code ADVANCED DIRECTIVES:  HCPOA and LW MOST FORM COMPLETE:  No HOSPICE EDUCATION PROVIDED:  No PPS:  Appetite is normal.  She uses a walker to ambulate. Duration of visit and documentation:  30 minutes.      Deborah Corn Brittnay Pigman, LCSW

## 2019-10-26 ENCOUNTER — Other Ambulatory Visit: Payer: PPO | Admitting: *Deleted

## 2019-10-26 ENCOUNTER — Other Ambulatory Visit: Payer: Self-pay

## 2019-10-26 ENCOUNTER — Other Ambulatory Visit: Payer: PPO | Admitting: Licensed Clinical Social Worker

## 2019-10-26 DIAGNOSIS — Z515 Encounter for palliative care: Secondary | ICD-10-CM

## 2019-10-27 DIAGNOSIS — G309 Alzheimer's disease, unspecified: Secondary | ICD-10-CM | POA: Diagnosis not present

## 2019-10-27 DIAGNOSIS — Z66 Do not resuscitate: Secondary | ICD-10-CM | POA: Diagnosis not present

## 2019-10-27 DIAGNOSIS — Z79890 Hormone replacement therapy (postmenopausal): Secondary | ICD-10-CM | POA: Diagnosis not present

## 2019-10-27 DIAGNOSIS — I4891 Unspecified atrial fibrillation: Secondary | ICD-10-CM | POA: Diagnosis not present

## 2019-10-27 DIAGNOSIS — G40909 Epilepsy, unspecified, not intractable, without status epilepticus: Secondary | ICD-10-CM | POA: Diagnosis not present

## 2019-10-27 DIAGNOSIS — D61818 Other pancytopenia: Secondary | ICD-10-CM | POA: Diagnosis not present

## 2019-10-27 DIAGNOSIS — E039 Hypothyroidism, unspecified: Secondary | ICD-10-CM | POA: Diagnosis not present

## 2019-10-27 DIAGNOSIS — B9620 Unspecified Escherichia coli [E. coli] as the cause of diseases classified elsewhere: Secondary | ICD-10-CM | POA: Diagnosis not present

## 2019-10-27 DIAGNOSIS — N3000 Acute cystitis without hematuria: Secondary | ICD-10-CM | POA: Diagnosis not present

## 2019-10-27 DIAGNOSIS — Z888 Allergy status to other drugs, medicaments and biological substances status: Secondary | ICD-10-CM | POA: Diagnosis not present

## 2019-10-27 DIAGNOSIS — R279 Unspecified lack of coordination: Secondary | ICD-10-CM | POA: Diagnosis not present

## 2019-10-27 DIAGNOSIS — G4733 Obstructive sleep apnea (adult) (pediatric): Secondary | ICD-10-CM | POA: Diagnosis not present

## 2019-10-27 DIAGNOSIS — Z79899 Other long term (current) drug therapy: Secondary | ICD-10-CM | POA: Diagnosis not present

## 2019-10-27 DIAGNOSIS — I10 Essential (primary) hypertension: Secondary | ICD-10-CM | POA: Diagnosis not present

## 2019-10-27 DIAGNOSIS — M1990 Unspecified osteoarthritis, unspecified site: Secondary | ICD-10-CM | POA: Diagnosis not present

## 2019-10-27 DIAGNOSIS — G9389 Other specified disorders of brain: Secondary | ICD-10-CM | POA: Diagnosis not present

## 2019-10-27 DIAGNOSIS — L89891 Pressure ulcer of other site, stage 1: Secondary | ICD-10-CM | POA: Diagnosis not present

## 2019-10-27 DIAGNOSIS — F0280 Dementia in other diseases classified elsewhere without behavioral disturbance: Secondary | ICD-10-CM | POA: Diagnosis not present

## 2019-10-27 DIAGNOSIS — F0390 Unspecified dementia without behavioral disturbance: Secondary | ICD-10-CM | POA: Diagnosis not present

## 2019-10-27 DIAGNOSIS — R1310 Dysphagia, unspecified: Secondary | ICD-10-CM | POA: Diagnosis not present

## 2019-10-27 DIAGNOSIS — R3981 Functional urinary incontinence: Secondary | ICD-10-CM | POA: Diagnosis not present

## 2019-10-27 DIAGNOSIS — Z9989 Dependence on other enabling machines and devices: Secondary | ICD-10-CM | POA: Diagnosis not present

## 2019-10-27 DIAGNOSIS — Z882 Allergy status to sulfonamides status: Secondary | ICD-10-CM | POA: Diagnosis not present

## 2019-10-27 DIAGNOSIS — Z20828 Contact with and (suspected) exposure to other viral communicable diseases: Secondary | ICD-10-CM | POA: Diagnosis not present

## 2019-10-27 DIAGNOSIS — R531 Weakness: Secondary | ICD-10-CM | POA: Diagnosis not present

## 2019-10-27 DIAGNOSIS — Z743 Need for continuous supervision: Secondary | ICD-10-CM | POA: Diagnosis not present

## 2019-10-27 DIAGNOSIS — R404 Transient alteration of awareness: Secondary | ICD-10-CM | POA: Diagnosis not present

## 2019-10-27 DIAGNOSIS — L89156 Pressure-induced deep tissue damage of sacral region: Secondary | ICD-10-CM | POA: Diagnosis not present

## 2019-10-27 DIAGNOSIS — R1319 Other dysphagia: Secondary | ICD-10-CM | POA: Diagnosis not present

## 2019-10-27 DIAGNOSIS — G3183 Dementia with Lewy bodies: Secondary | ICD-10-CM | POA: Diagnosis not present

## 2019-10-27 DIAGNOSIS — J310 Chronic rhinitis: Secondary | ICD-10-CM | POA: Diagnosis not present

## 2019-10-27 DIAGNOSIS — H509 Unspecified strabismus: Secondary | ICD-10-CM | POA: Diagnosis not present

## 2019-10-27 DIAGNOSIS — R569 Unspecified convulsions: Secondary | ICD-10-CM | POA: Diagnosis not present

## 2019-10-27 DIAGNOSIS — E785 Hyperlipidemia, unspecified: Secondary | ICD-10-CM | POA: Diagnosis not present

## 2019-10-27 DIAGNOSIS — E890 Postprocedural hypothyroidism: Secondary | ICD-10-CM | POA: Diagnosis not present

## 2019-10-27 DIAGNOSIS — R633 Feeding difficulties: Secondary | ICD-10-CM | POA: Diagnosis not present

## 2019-10-27 DIAGNOSIS — D329 Benign neoplasm of meninges, unspecified: Secondary | ICD-10-CM | POA: Diagnosis not present

## 2019-10-27 DIAGNOSIS — M545 Low back pain: Secondary | ICD-10-CM | POA: Diagnosis not present

## 2019-10-27 DIAGNOSIS — N3080 Other cystitis without hematuria: Secondary | ICD-10-CM | POA: Diagnosis not present

## 2019-10-27 DIAGNOSIS — R7881 Bacteremia: Secondary | ICD-10-CM | POA: Diagnosis not present

## 2019-10-27 DIAGNOSIS — N390 Urinary tract infection, site not specified: Secondary | ICD-10-CM | POA: Diagnosis not present

## 2019-10-27 DIAGNOSIS — G300 Alzheimer's disease with early onset: Secondary | ICD-10-CM | POA: Diagnosis not present

## 2019-10-29 ENCOUNTER — Other Ambulatory Visit: Payer: Self-pay

## 2019-10-29 MED ORDER — GENERIC EXTERNAL MEDICATION
1.00 | Status: DC
Start: 2019-10-29 — End: 2019-10-29

## 2019-10-29 MED ORDER — FML-S LIQUIFILM OP
10.00 | OPHTHALMIC | Status: DC
Start: 2019-10-29 — End: 2019-10-29

## 2019-10-29 MED ORDER — GENERIC EXTERNAL MEDICATION
750.00 | Status: DC
Start: 2019-10-29 — End: 2019-10-29

## 2019-10-29 MED ORDER — CVS EAR DROPS OT
40.00 | OTIC | Status: DC
Start: 2019-10-30 — End: 2019-10-29

## 2019-10-29 MED ORDER — Medication
Status: DC
Start: 2019-10-29 — End: 2019-10-29

## 2019-10-29 MED ORDER — INFUSION PUMP KIT IR 1200 KIT
PACK | Status: DC
Start: ? — End: 2019-10-29

## 2019-10-29 NOTE — Progress Notes (Signed)
COMMUNITY PALLIATIVE CARE SW NOTE  PATIENT NAME: Deborah Davies DOB: 1956-06-11 MRN: [id number]  PRIMARY CARE PROVIDER: Benjamine Sprague, MD  RESPONSIBLE PARTY:  Acct ID - Guarantor Home Phone Work Phone Relationship Acct Type  [id number] ARIEN, [phone] Self P/F     [address] [place], [place] [address]     PLAN OF CARE and INTERVENTIONS:             1. GOALS OF CARE/ ADVANCE CARE PLANNING:  Goal is for patient to remain at home.  Patient is a full code. 2. SOCIAL/EMOTIONAL/SPIRITUAL ASSESSMENT/ INTERVENTIONS:  SW and Palliative Care RN, Daryl Eastern, met with patient and her husband, [name], in their home.  Patient was observed ambulating with her walker.  She did not display any nonverbal indicators of pain.  She displayed a bright affect at times and said 1-2 words.  She appeared distracted and was fidgeting with her gate belt.  Her husband is very devoted and provided information regarding her care.  SW encouraged him to continue participating in self-care.  [name] has obtained a private caregiver to provide assistance.  SW provided active listening and supportive counseling.   3. PATIENT/CAREGIVER EDUCATION/ COPING:  [name] copes by problem-solving. 4. PERSONAL EMERGENCY PLAN:  EMS is contacted. 5. COMMUNITY RESOURCES COORDINATION/ HEALTH CARE NAVIGATION:  None. 6. FINANCIAL/LEGAL CONCERNS/INTERVENTIONS:  There are future financial concerns.     SOCIAL HX:  Social History   Tobacco Use  . Smoking status: Not on file  Substance Use Topics  . Alcohol use: Not on file    CODE STATUS:  Full Code ADVANCED DIRECTIVES: HCPOA and LW MOST FORM COMPLETE:  No HOSPICE EDUCATION PROVIDED: No PPS:  She has a normal appetite.  Patient uses a walker. Duration of visit and documentation:  75 minutes.      Creola Corn Romie Tay, LCSW

## 2019-10-30 NOTE — Progress Notes (Signed)
COMMUNITY PALLIATIVE CARE RN NOTE  PATIENT NAME: Deborah Davies DOB: [birth year] MRN: [id number]  PRIMARY CARE PROVIDER: Benjamine Sprague, MD  RESPONSIBLE PARTY:  Acct ID - Guarantor Home Phone Work Phone Relationship Acct Type  [id number] Deborah Davies, [phone] Self P/F     [address] [place], [place] [address]   Covid-19 Pre-screening Negative  PLAN OF CARE and INTERVENTION:  1. ADVANCE CARE PLANNING/GOALS OF CARE: Goal is for patient to continue to get stronger if possible. She is a Full code. 2. PATIENT/CAREGIVER EDUCATION: Safe Mobility/Transfers, Symptom Management 3. DISEASE STATUS: Joint visit made with Palliative care SW, Lynn Duffy. Met with patient and her husband in their home. Upon arrival, was able to observe husband assisting patient with ambulation with her walker. She has a gait belt on with handles. Husband had to continually instruct patient on what to do and guide her in the direction he wanted her to go. She is able to take small steps using her walker, but she was having difficulties scooting back to her chair with sitting. She is able to say some clear words, but does not speak much and is slow to respond. Throughout visit, she sat up in her wheelchair fidgeting with her Harrel Lemon lift pad. She likes to stay busy with her hands. Husband states that he has not had to utilize the [place] for transfers for the past 2 days. He has been able to assist her with standing and ambulation. She is mainly using the bedside commode. She also requires assistance with bathing and dressing. She was able to get her hair washed today by a private caregiver at the kitchen sink. The caregiver is coming on a prn basis for now, but he says that he will probably be utilizing her services more often in the future, She is still unable to get into the shower. She has intermittent bowel and bladder incontinence, but this has been less over the past month. She was recently discharged from  PT/OT services. Husband is looking into shower chair options that will work best and a hand held shower piece. He is noticing that she is leaning more to the right when sitting up, especially as the evening approaches. She is feeding herself independently, but may take a long time to complete meals. She does cough occasionally when drinking liquids. She does have thickener available when needed. She continues to chew her medications. She is eating smaller meals regularly throughout the day, consuming 75-100% most times. She is staying awake longer during the day and sleeping well at night. She has not been using her cpap at night, as she keeps pulling it off. Husband was able to take patient outside yesterday, but did have some slight difficulties getting her outside in the wheelchair. He is looking into building a ramp and wants to start taking her for rides in the car. Will continue to monitor.  HISTORY OF PRESENT ILLNESS: This is a 63 yo female who resides at home with her husband. Palliative care team continues to follow patient. Will continue to visit monthly and PRN.   CODE STATUS: Full Code ADVANCED DIRECTIVES: Y MOST FORM: no PPS: 30%   PHYSICAL EXAM:   VITALS: Today's Vitals   10/26/19 1348  BP: (!) 132/93  Pulse: 71  Resp: 16  Temp: 97.8 F (36.6 C)  TempSrc: Temporal  SpO2: 96%  PainSc: 0-No pain    LUNGS: clear to auscultation  CARDIAC: Cor RRR EXTREMITIES: No edema SKIN: Skin color, texture,  turgor normal. No rashes or lesions  NEURO: Alert to self, minimally verbal, slow to respond, ambulatory w/assistance and walker   (Duration of visit and documentation 90 minutes)   Daryl Eastern, RN BSN

## 2019-11-05 DIAGNOSIS — F0280 Dementia in other diseases classified elsewhere without behavioral disturbance: Secondary | ICD-10-CM | POA: Diagnosis not present

## 2019-11-05 DIAGNOSIS — G300 Alzheimer's disease with early onset: Secondary | ICD-10-CM | POA: Diagnosis not present

## 2019-11-20 DIAGNOSIS — F0280 Dementia in other diseases classified elsewhere without behavioral disturbance: Secondary | ICD-10-CM | POA: Diagnosis not present

## 2019-11-20 DIAGNOSIS — G40909 Epilepsy, unspecified, not intractable, without status epilepticus: Secondary | ICD-10-CM | POA: Diagnosis not present

## 2019-11-20 DIAGNOSIS — G300 Alzheimer's disease with early onset: Secondary | ICD-10-CM | POA: Diagnosis not present

## 2019-12-24 DIAGNOSIS — S12100D Unspecified displaced fracture of second cervical vertebra, subsequent encounter for fracture with routine healing: Secondary | ICD-10-CM | POA: Diagnosis not present

## 2019-12-26 DIAGNOSIS — Z833 Family history of diabetes mellitus: Secondary | ICD-10-CM | POA: Diagnosis not present

## 2019-12-26 DIAGNOSIS — Z823 Family history of stroke: Secondary | ICD-10-CM | POA: Diagnosis not present

## 2019-12-26 DIAGNOSIS — G300 Alzheimer's disease with early onset: Secondary | ICD-10-CM | POA: Diagnosis not present

## 2019-12-26 DIAGNOSIS — Z8679 Personal history of other diseases of the circulatory system: Secondary | ICD-10-CM | POA: Diagnosis not present

## 2019-12-26 DIAGNOSIS — R1312 Dysphagia, oropharyngeal phase: Secondary | ICD-10-CM | POA: Diagnosis not present

## 2019-12-26 DIAGNOSIS — R000 Tachycardia, unspecified: Secondary | ICD-10-CM | POA: Diagnosis not present

## 2019-12-26 DIAGNOSIS — G20 Parkinson's disease: Secondary | ICD-10-CM | POA: Diagnosis not present

## 2019-12-26 DIAGNOSIS — F0390 Unspecified dementia without behavioral disturbance: Secondary | ICD-10-CM | POA: Diagnosis not present

## 2019-12-26 DIAGNOSIS — R1310 Dysphagia, unspecified: Secondary | ICD-10-CM | POA: Diagnosis not present

## 2019-12-26 DIAGNOSIS — Z8249 Family history of ischemic heart disease and other diseases of the circulatory system: Secondary | ICD-10-CM | POA: Diagnosis not present

## 2019-12-26 DIAGNOSIS — Z66 Do not resuscitate: Secondary | ICD-10-CM | POA: Diagnosis not present

## 2019-12-26 DIAGNOSIS — Z743 Need for continuous supervision: Secondary | ICD-10-CM | POA: Diagnosis not present

## 2019-12-26 DIAGNOSIS — Z20822 Contact with and (suspected) exposure to covid-19: Secondary | ICD-10-CM | POA: Diagnosis not present

## 2019-12-26 DIAGNOSIS — I10 Essential (primary) hypertension: Secondary | ICD-10-CM | POA: Diagnosis not present

## 2019-12-26 DIAGNOSIS — D329 Benign neoplasm of meninges, unspecified: Secondary | ICD-10-CM | POA: Diagnosis not present

## 2019-12-26 DIAGNOSIS — R633 Feeding difficulties: Secondary | ICD-10-CM | POA: Diagnosis not present

## 2019-12-26 DIAGNOSIS — Z79899 Other long term (current) drug therapy: Secondary | ICD-10-CM | POA: Diagnosis not present

## 2019-12-26 DIAGNOSIS — R569 Unspecified convulsions: Secondary | ICD-10-CM | POA: Diagnosis not present

## 2019-12-26 DIAGNOSIS — G40409 Other generalized epilepsy and epileptic syndromes, not intractable, without status epilepticus: Secondary | ICD-10-CM | POA: Diagnosis not present

## 2019-12-26 DIAGNOSIS — E785 Hyperlipidemia, unspecified: Secondary | ICD-10-CM | POA: Diagnosis not present

## 2019-12-26 DIAGNOSIS — G40909 Epilepsy, unspecified, not intractable, without status epilepticus: Secondary | ICD-10-CM | POA: Diagnosis not present

## 2019-12-26 DIAGNOSIS — Z515 Encounter for palliative care: Secondary | ICD-10-CM | POA: Diagnosis not present

## 2019-12-26 DIAGNOSIS — R279 Unspecified lack of coordination: Secondary | ICD-10-CM | POA: Diagnosis not present

## 2019-12-26 DIAGNOSIS — E039 Hypothyroidism, unspecified: Secondary | ICD-10-CM | POA: Diagnosis not present

## 2019-12-26 DIAGNOSIS — M6281 Muscle weakness (generalized): Secondary | ICD-10-CM | POA: Diagnosis not present

## 2019-12-26 DIAGNOSIS — F0280 Dementia in other diseases classified elsewhere without behavioral disturbance: Secondary | ICD-10-CM | POA: Diagnosis not present

## 2019-12-26 DIAGNOSIS — G9389 Other specified disorders of brain: Secondary | ICD-10-CM | POA: Diagnosis not present

## 2019-12-27 ENCOUNTER — Telehealth: Payer: Self-pay | Admitting: Licensed Clinical Social Worker

## 2019-12-27 DIAGNOSIS — Z515 Encounter for palliative care: Secondary | ICD-10-CM | POA: Diagnosis not present

## 2019-12-27 DIAGNOSIS — E039 Hypothyroidism, unspecified: Secondary | ICD-10-CM | POA: Diagnosis not present

## 2019-12-27 DIAGNOSIS — G40409 Other generalized epilepsy and epileptic syndromes, not intractable, without status epilepticus: Secondary | ICD-10-CM | POA: Diagnosis not present

## 2019-12-27 DIAGNOSIS — R1310 Dysphagia, unspecified: Secondary | ICD-10-CM | POA: Diagnosis not present

## 2019-12-27 DIAGNOSIS — F0390 Unspecified dementia without behavioral disturbance: Secondary | ICD-10-CM | POA: Diagnosis not present

## 2019-12-27 DIAGNOSIS — G300 Alzheimer's disease with early onset: Secondary | ICD-10-CM | POA: Diagnosis not present

## 2019-12-27 DIAGNOSIS — R1312 Dysphagia, oropharyngeal phase: Secondary | ICD-10-CM | POA: Diagnosis not present

## 2019-12-27 DIAGNOSIS — I10 Essential (primary) hypertension: Secondary | ICD-10-CM | POA: Diagnosis not present

## 2019-12-27 DIAGNOSIS — R569 Unspecified convulsions: Secondary | ICD-10-CM | POA: Diagnosis not present

## 2019-12-27 DIAGNOSIS — F0280 Dementia in other diseases classified elsewhere without behavioral disturbance: Secondary | ICD-10-CM | POA: Diagnosis not present

## 2019-12-27 DIAGNOSIS — R633 Feeding difficulties: Secondary | ICD-10-CM | POA: Diagnosis not present

## 2019-12-27 DIAGNOSIS — G20 Parkinson's disease: Secondary | ICD-10-CM | POA: Diagnosis not present

## 2019-12-27 MED ORDER — ENOXAPARIN SODIUM 40 MG/0.4ML ~~LOC~~ SOLN
40.00 | SUBCUTANEOUS | Status: DC
Start: 2019-12-28 — End: 2019-12-27

## 2019-12-27 MED ORDER — GENERIC EXTERNAL MEDICATION
1000.00 | Status: DC
Start: 2019-12-27 — End: 2019-12-27

## 2019-12-27 MED ORDER — LABETALOL HCL 5 MG/ML IV SOLN
10.00 | INTRAVENOUS | Status: DC
Start: ? — End: 2019-12-27

## 2019-12-27 NOTE — Telephone Encounter (Signed)
Palliative Care SW phoned patient's husband, [name], to schedule a home visit.  His vm was full and SW could not leave a message.

## 2019-12-28 DIAGNOSIS — F0280 Dementia in other diseases classified elsewhere without behavioral disturbance: Secondary | ICD-10-CM | POA: Diagnosis not present

## 2019-12-28 DIAGNOSIS — G20 Parkinson's disease: Secondary | ICD-10-CM | POA: Diagnosis not present

## 2019-12-28 DIAGNOSIS — G300 Alzheimer's disease with early onset: Secondary | ICD-10-CM | POA: Diagnosis not present

## 2019-12-28 DIAGNOSIS — E039 Hypothyroidism, unspecified: Secondary | ICD-10-CM | POA: Diagnosis not present

## 2019-12-28 DIAGNOSIS — G40409 Other generalized epilepsy and epileptic syndromes, not intractable, without status epilepticus: Secondary | ICD-10-CM | POA: Diagnosis not present

## 2019-12-28 DIAGNOSIS — I10 Essential (primary) hypertension: Secondary | ICD-10-CM | POA: Diagnosis not present

## 2019-12-28 DIAGNOSIS — R1310 Dysphagia, unspecified: Secondary | ICD-10-CM | POA: Diagnosis not present

## 2019-12-28 DIAGNOSIS — F0390 Unspecified dementia without behavioral disturbance: Secondary | ICD-10-CM | POA: Diagnosis not present

## 2019-12-28 DIAGNOSIS — R569 Unspecified convulsions: Secondary | ICD-10-CM | POA: Diagnosis not present

## 2019-12-28 DIAGNOSIS — Z515 Encounter for palliative care: Secondary | ICD-10-CM | POA: Diagnosis not present

## 2019-12-29 DIAGNOSIS — I10 Essential (primary) hypertension: Secondary | ICD-10-CM | POA: Diagnosis not present

## 2019-12-29 DIAGNOSIS — F0280 Dementia in other diseases classified elsewhere without behavioral disturbance: Secondary | ICD-10-CM | POA: Diagnosis not present

## 2019-12-29 DIAGNOSIS — G40409 Other generalized epilepsy and epileptic syndromes, not intractable, without status epilepticus: Secondary | ICD-10-CM | POA: Diagnosis not present

## 2019-12-29 DIAGNOSIS — G300 Alzheimer's disease with early onset: Secondary | ICD-10-CM | POA: Diagnosis not present

## 2019-12-29 DIAGNOSIS — E039 Hypothyroidism, unspecified: Secondary | ICD-10-CM | POA: Diagnosis not present

## 2019-12-30 DIAGNOSIS — G300 Alzheimer's disease with early onset: Secondary | ICD-10-CM | POA: Diagnosis not present

## 2019-12-30 DIAGNOSIS — F0280 Dementia in other diseases classified elsewhere without behavioral disturbance: Secondary | ICD-10-CM | POA: Diagnosis not present

## 2019-12-30 DIAGNOSIS — G40409 Other generalized epilepsy and epileptic syndromes, not intractable, without status epilepticus: Secondary | ICD-10-CM | POA: Diagnosis not present

## 2019-12-30 DIAGNOSIS — I10 Essential (primary) hypertension: Secondary | ICD-10-CM | POA: Diagnosis not present

## 2019-12-30 DIAGNOSIS — E039 Hypothyroidism, unspecified: Secondary | ICD-10-CM | POA: Diagnosis not present

## 2019-12-31 DIAGNOSIS — G40409 Other generalized epilepsy and epileptic syndromes, not intractable, without status epilepticus: Secondary | ICD-10-CM | POA: Diagnosis not present

## 2019-12-31 DIAGNOSIS — F0280 Dementia in other diseases classified elsewhere without behavioral disturbance: Secondary | ICD-10-CM | POA: Diagnosis not present

## 2019-12-31 DIAGNOSIS — G300 Alzheimer's disease with early onset: Secondary | ICD-10-CM | POA: Diagnosis not present

## 2019-12-31 DIAGNOSIS — E039 Hypothyroidism, unspecified: Secondary | ICD-10-CM | POA: Diagnosis not present

## 2019-12-31 DIAGNOSIS — I10 Essential (primary) hypertension: Secondary | ICD-10-CM | POA: Diagnosis not present

## 2020-01-01 DIAGNOSIS — G300 Alzheimer's disease with early onset: Secondary | ICD-10-CM | POA: Diagnosis not present

## 2020-01-01 DIAGNOSIS — F0280 Dementia in other diseases classified elsewhere without behavioral disturbance: Secondary | ICD-10-CM | POA: Diagnosis not present

## 2020-01-01 DIAGNOSIS — G40909 Epilepsy, unspecified, not intractable, without status epilepticus: Secondary | ICD-10-CM | POA: Diagnosis not present

## 2020-01-02 DIAGNOSIS — G300 Alzheimer's disease with early onset: Secondary | ICD-10-CM | POA: Diagnosis not present

## 2020-01-02 DIAGNOSIS — G40109 Localization-related (focal) (partial) symptomatic epilepsy and epileptic syndromes with simple partial seizures, not intractable, without status epilepticus: Secondary | ICD-10-CM | POA: Diagnosis not present

## 2020-01-02 DIAGNOSIS — F0280 Dementia in other diseases classified elsewhere without behavioral disturbance: Secondary | ICD-10-CM | POA: Diagnosis not present

## 2020-01-17 DIAGNOSIS — N390 Urinary tract infection, site not specified: Secondary | ICD-10-CM | POA: Diagnosis not present

## 2020-02-14 DIAGNOSIS — Z9989 Dependence on other enabling machines and devices: Secondary | ICD-10-CM | POA: Diagnosis not present

## 2020-02-14 DIAGNOSIS — R569 Unspecified convulsions: Secondary | ICD-10-CM | POA: Diagnosis not present

## 2020-02-14 DIAGNOSIS — G300 Alzheimer's disease with early onset: Secondary | ICD-10-CM | POA: Diagnosis not present

## 2020-02-14 DIAGNOSIS — G4733 Obstructive sleep apnea (adult) (pediatric): Secondary | ICD-10-CM | POA: Diagnosis not present

## 2020-04-24 DIAGNOSIS — F0280 Dementia in other diseases classified elsewhere without behavioral disturbance: Secondary | ICD-10-CM | POA: Diagnosis not present

## 2020-04-24 DIAGNOSIS — Z9989 Dependence on other enabling machines and devices: Secondary | ICD-10-CM | POA: Diagnosis not present

## 2020-04-24 DIAGNOSIS — G300 Alzheimer's disease with early onset: Secondary | ICD-10-CM | POA: Diagnosis not present

## 2020-04-24 DIAGNOSIS — G40909 Epilepsy, unspecified, not intractable, without status epilepticus: Secondary | ICD-10-CM | POA: Diagnosis not present

## 2020-09-03 DIAGNOSIS — G300 Alzheimer's disease with early onset: Secondary | ICD-10-CM | POA: Diagnosis not present

## 2020-09-03 DIAGNOSIS — G40909 Epilepsy, unspecified, not intractable, without status epilepticus: Secondary | ICD-10-CM | POA: Diagnosis not present

## 2020-09-03 DIAGNOSIS — F0280 Dementia in other diseases classified elsewhere without behavioral disturbance: Secondary | ICD-10-CM | POA: Diagnosis not present

## 2020-09-03 DIAGNOSIS — Z79899 Other long term (current) drug therapy: Secondary | ICD-10-CM | POA: Diagnosis not present

## 2020-09-12 DIAGNOSIS — Z7401 Bed confinement status: Secondary | ICD-10-CM | POA: Diagnosis not present

## 2020-09-12 DIAGNOSIS — R531 Weakness: Secondary | ICD-10-CM | POA: Diagnosis not present

## 2021-01-07 DIAGNOSIS — F0280 Dementia in other diseases classified elsewhere without behavioral disturbance: Secondary | ICD-10-CM | POA: Diagnosis not present

## 2021-01-07 DIAGNOSIS — G40802 Other epilepsy, not intractable, without status epilepticus: Secondary | ICD-10-CM | POA: Diagnosis not present

## 2021-01-07 DIAGNOSIS — G300 Alzheimer's disease with early onset: Secondary | ICD-10-CM | POA: Diagnosis not present

## 2021-02-17 DIAGNOSIS — I959 Hypotension, unspecified: Secondary | ICD-10-CM | POA: Diagnosis not present

## 2021-02-17 DIAGNOSIS — Z7401 Bed confinement status: Secondary | ICD-10-CM | POA: Diagnosis not present

## 2021-05-28 DIAGNOSIS — Z7401 Bed confinement status: Secondary | ICD-10-CM | POA: Diagnosis not present

## 2021-05-28 DIAGNOSIS — R531 Weakness: Secondary | ICD-10-CM | POA: Diagnosis not present

## 2021-05-28 DIAGNOSIS — R404 Transient alteration of awareness: Secondary | ICD-10-CM | POA: Diagnosis not present

## 2021-08-20 DIAGNOSIS — G300 Alzheimer's disease with early onset: Secondary | ICD-10-CM | POA: Diagnosis not present

## 2021-08-20 DIAGNOSIS — F02C0 Dementia in other diseases classified elsewhere, severe, without behavioral disturbance, psychotic disturbance, mood disturbance, and anxiety: Secondary | ICD-10-CM | POA: Diagnosis not present

## 2021-08-20 DIAGNOSIS — R569 Unspecified convulsions: Secondary | ICD-10-CM | POA: Diagnosis not present

## 2021-08-20 DIAGNOSIS — Z79899 Other long term (current) drug therapy: Secondary | ICD-10-CM | POA: Diagnosis not present

## 2021-09-02 DIAGNOSIS — R531 Weakness: Secondary | ICD-10-CM | POA: Diagnosis not present

## 2021-09-02 DIAGNOSIS — R279 Unspecified lack of coordination: Secondary | ICD-10-CM | POA: Diagnosis not present

## 2022-06-15 DEATH — deceased
